# Patient Record
Sex: Male | Born: 1980 | Race: White | Hispanic: No | Marital: Single | Smoking: Never smoker
Health system: Southern US, Community
[De-identification: ages and names within clinical notes are randomized; demographics above are authoritative.]

## PROBLEM LIST (undated history)

## (undated) DIAGNOSIS — R8281 Pyuria: Secondary | ICD-10-CM

## (undated) DIAGNOSIS — I10 Essential (primary) hypertension: Secondary | ICD-10-CM

## (undated) DIAGNOSIS — N189 Chronic kidney disease, unspecified: Secondary | ICD-10-CM

## (undated) DIAGNOSIS — C801 Malignant (primary) neoplasm, unspecified: Secondary | ICD-10-CM

## (undated) DIAGNOSIS — F419 Anxiety disorder, unspecified: Secondary | ICD-10-CM

## (undated) HISTORY — DX: Pyuria: R82.81

## (undated) HISTORY — PX: NO PAST SURGERIES: SHX2092

---

## 2016-04-03 LAB — BASIC METABOLIC PANEL
BUN: 9 mg/dL (ref 4–21)
CREATININE: 1 mg/dL (ref 0.6–1.3)
Glucose: 82 mg/dL
Potassium: 4.3 mmol/L (ref 3.4–5.3)
SODIUM: 142 mmol/L (ref 137–147)

## 2017-03-23 ENCOUNTER — Ambulatory Visit (INDEPENDENT_AMBULATORY_CARE_PROVIDER_SITE_OTHER): Payer: Managed Care, Other (non HMO) | Admitting: Osteopathic Medicine

## 2017-03-23 ENCOUNTER — Encounter: Payer: Self-pay | Admitting: Osteopathic Medicine

## 2017-03-23 VITALS — BP 162/92 | HR 65 | Ht 73.0 in | Wt 268.0 lb

## 2017-03-23 DIAGNOSIS — I10 Essential (primary) hypertension: Secondary | ICD-10-CM | POA: Diagnosis not present

## 2017-03-23 DIAGNOSIS — F419 Anxiety disorder, unspecified: Secondary | ICD-10-CM

## 2017-03-23 DIAGNOSIS — G8929 Other chronic pain: Secondary | ICD-10-CM

## 2017-03-23 DIAGNOSIS — N390 Urinary tract infection, site not specified: Secondary | ICD-10-CM | POA: Diagnosis not present

## 2017-03-23 DIAGNOSIS — M545 Low back pain: Secondary | ICD-10-CM

## 2017-03-23 DIAGNOSIS — R8281 Pyuria: Secondary | ICD-10-CM

## 2017-03-23 LAB — LIPID PANEL
Cholesterol: 177 mg/dL
HDL: 52 mg/dL
LDL Cholesterol: 110 mg/dL — ABNORMAL HIGH
Total CHOL/HDL Ratio: 3.4 ratio
Triglycerides: 75 mg/dL
VLDL: 15 mg/dL

## 2017-03-23 LAB — COMPLETE METABOLIC PANEL WITHOUT GFR
ALT: 18 U/L (ref 9–46)
AST: 19 U/L (ref 10–40)
Albumin: 4.4 g/dL (ref 3.6–5.1)
Alkaline Phosphatase: 63 U/L (ref 40–115)
BUN: 12 mg/dL (ref 7–25)
CO2: 22 mmol/L (ref 20–31)
Calcium: 9.6 mg/dL (ref 8.6–10.3)
Chloride: 105 mmol/L (ref 98–110)
Creat: 0.87 mg/dL (ref 0.60–1.35)
GFR, Est African American: >89 mL/min
GFR, Est Non African American: >89 mL/min
Glucose, Bld: 98 mg/dL (ref 65–99)
Potassium: 4.2 mmol/L (ref 3.5–5.3)
Sodium: 142 mmol/L (ref 135–146)
Total Bilirubin: 0.5 mg/dL (ref 0.2–1.2)
Total Protein: 7.2 g/dL (ref 6.1–8.1)

## 2017-03-23 LAB — CBC WITH DIFFERENTIAL/PLATELET
Basophils Absolute: 77 {cells}/uL (ref 0–200)
Basophils Relative: 1 %
Eosinophils Absolute: 77 {cells}/uL (ref 15–500)
Eosinophils Relative: 1 %
HCT: 46.5 % (ref 38.5–50.0)
Hemoglobin: 15.8 g/dL (ref 13.2–17.1)
Lymphocytes Relative: 21 %
Lymphs Abs: 1617 {cells}/uL (ref 850–3900)
MCH: 29.4 pg (ref 27.0–33.0)
MCHC: 34 g/dL (ref 32.0–36.0)
MCV: 86.4 fL (ref 80.0–100.0)
MPV: 9.1 fL (ref 7.5–12.5)
Monocytes Absolute: 770 {cells}/uL (ref 200–950)
Monocytes Relative: 10 %
Neutro Abs: 5159 {cells}/uL (ref 1500–7800)
Neutrophils Relative %: 67 %
Platelets: 288 10*3/uL (ref 140–400)
RBC: 5.38 MIL/uL (ref 4.20–5.80)
RDW: 13.9 % (ref 11.0–15.0)
WBC: 7.7 10*3/uL (ref 3.8–10.8)

## 2017-03-23 LAB — TSH: TSH: 1.94 m[IU]/L (ref 0.40–4.50)

## 2017-03-23 NOTE — Patient Instructions (Signed)
Check BP outside the office: goal is 130/80 or less.  Labs today See me in 1-2 weeks for BP follow-up and lab review, plus annual checkup

## 2017-03-23 NOTE — Progress Notes (Signed)
HPI: Darryl Roach is a 36 y.o. male  who presents to [place] today, 03/23/17,  for chief complaint of:  Chief Complaint  Patient presents with  . Establish Care    BANK PAIN AND ANXIETY    Anxiety/depression:  . Quality: mostly anxiety . Severity: mild to moderate . Duration: years . Modifying factors: Previously on Paxil, Lexapro, Xanax but all these caused unwanted side effects . Assoc signs/symptoms: no SI  Back Pain . Location: lower mostly on L and wraps around to L abdomen on occasion . Quality: soreness . Duration & Timing: on and off several years . Context: no injury, "history kyphosis"  . Assoc signs/symptoms: no weakness/numbness, no shooting leg pain   Elevated BP: Records reviewed - was 161/110 09/2016 at other visit for back pain at [facility]. No CP/SOB, Been told it was high in the past because of anxiety.   History of abnormal urine: In the past, cloudy urine without dysuria/frequency. Was treated for prostatitis, never had any pain. No STD exposure.   Past medical, surgical, social and family history reviewed: There are no active problems to display for this patient.  No past surgical history on file. Social History  Substance Use Topics  . Smoking status: Not on file  . Smokeless tobacco: Not on file  . Alcohol use Not on file   No family history on file.   Current medication list and allergy/intolerance information reviewed:   No current outpatient prescriptions on file.   No current facility-administered medications for this visit.    Allergies not on file    Review of Systems:  Constitutional:  No  fever, no chills, No recent illness, No unintentional weight changes. +significant fatigue.   HEENT: No  headache, no vision change, no hearing change, No sore throat, No  sinus pressure  Cardiac: No  chest pain, No  pressure, No palpitations, No  Orthopnea  Respiratory:  No  shortness of breath. No   Cough  Gastrointestinal: No  abdominal pain, No  nausea, No  vomiting,  No  blood in stool, No  diarrhea, No  constipation   Musculoskeletal: No new myalgia/arthralgia  Genitourinary: No  incontinence, No  abnormal genital bleeding, No abnormal genital discharge  Skin: No  Rash, No other wounds/concerning lesions  Hem/Onc: No  easy bruising/bleeding, No  abnormal lymph node  Endocrine: No cold intolerance,  No heat intolerance. No polyuria/polydipsia/polyphagia   Neurologic: No  weakness, No  dizziness, No  slurred speech/focal weakness/facial droop  Psychiatric: +concerns with depression, +concerns with anxiety, No sleep problems, No mood problems, +ED  Exam:  BP (!) 162/92   Pulse 65   Ht 6\' 1"  (1.854 m)   Wt 268 lb (121.6 kg)   BMI 35.36 kg/m   Constitutional: VS see above. General Appearance: alert, well-developed, well-nourished, NAD  Eyes: Normal lids and conjunctive, non-icteric sclera  Ears, Nose, Mouth, Throat: MMM, Normal external inspection ears/nares/mouth/lips/gums.  Neck: No masses, trachea midline. No thyroid enlargement. No tenderness/mass appreciated. No lymphadenopathy  Respiratory: Normal respiratory effort. no wheeze, no rhonchi, no rales  Cardiovascular: S1/S2 normal, no murmur, no rub/gallop auscultated. RRR. No lower extremity edema.   Musculoskeletal: Gait normal. No clubbing/cyanosis of digits. (+)thoracic kyphosis pronounced, (+)tender quadratus lumborum area on L, no midline tenderness   Neurological: Normal balance/coordination. No tremor.   Skin: warm, dry, intact. No rash/ulcer.  Psychiatric: Normal judgment/insight. Normal mood and affect. Oriented x3.   Depression screen [facility] 2/9 03/23/2017  Decreased  Interest 3  Down, Depressed, Hopeless 1  PHQ - 2 Score 4  Altered sleeping 2  Tired, decreased energy 1  Change in appetite 3  Feeling bad or failure about yourself  1  Trouble concentrating 1  Moving slowly or fidgety/restless 2   Suicidal thoughts 1  PHQ-9 Score 15   GAD 7 : Generalized Anxiety Score 03/23/2017  Nervous, Anxious, on Edge 3  Control/stop worrying 3  Worry too much - different things 3  Trouble relaxing 2  Restless 2  Easily annoyed or irritable 2  Afraid - awful might happen 2  Total GAD 7 Score 17     ASSESSMENT/PLAN:   Hypertension, unspecified type - Check blood pressure outside the office, goal is 130/80 or less, need to consider medications but would like labs first - Plan: CBC with Differential/Platelet, COMPLETE METABOLIC PANEL WITH GFR, Lipid panel, TSH  Sterile pyuria - History of, urine has been normal lately, will recheck today - Plan: Urinalysis, Routine w reflex microscopic  Anxiety - Consider SSRI/other versus beta blocker for pressure control plus anxiety  Chronic bilateral low back pain without sciatica - Strengthening exercises advised, consider physical therapy. Discussed weight loss, core strengthening - Plan: CBC with Differential/Platelet, COMPLETE METABOLIC PANEL WITH GFR, Urinalysis, Routine w reflex microscopic    Patient Instructions  Check BP outside the office: goal is 130/80 or less.  Labs today See me in 1-2 weeks for BP follow-up and lab review, plus annual checkup     Visit summary with medication list and pertinent instructions was printed for patient to review. All questions at time of visit were answered - patient instructed to contact office with any additional concerns. ER/RTC precautions were reviewed with the patient. Follow-up plan: Return for ANNUAL AND BP RECHECK 1-2 weeks, sooner if needed.

## 2017-03-24 LAB — URINALYSIS, ROUTINE W REFLEX MICROSCOPIC
BILIRUBIN URINE: NEGATIVE
GLUCOSE, UA: NEGATIVE
Hgb urine dipstick: NEGATIVE
Ketones, ur: NEGATIVE
Leukocytes, UA: NEGATIVE
Nitrite: NEGATIVE
PH: 7.5 (ref 5.0–8.0)
PROTEIN: NEGATIVE
Specific Gravity, Urine: 1.019 (ref 1.001–1.035)

## 2017-04-14 ENCOUNTER — Encounter: Payer: Self-pay | Admitting: Osteopathic Medicine

## 2017-04-14 DIAGNOSIS — R8281 Pyuria: Secondary | ICD-10-CM

## 2017-04-14 HISTORY — DX: Pyuria: R82.81

## 2017-04-14 LAB — URINALYSIS
BILIRUBIN: NEGATIVE
Bilirubin: 1
GLUCOSE: NEGATIVE
Glucose: NEGATIVE
KETONE: 15
LEUKOCYTE ESTERASE: NEGATIVE
Leukocyte Esterase: 15
NITRITE: NEGATIVE
Nitrite: NEGATIVE
Protein: 30
Specific Gravity: 1.02
Specific Gravity: 1.02
UROBILINOGEN UA: NORMAL
UROBILINOGEN UA: NORMAL
pH: 6
pH: 6.5

## 2017-07-27 ENCOUNTER — Ambulatory Visit (INDEPENDENT_AMBULATORY_CARE_PROVIDER_SITE_OTHER): Payer: Managed Care, Other (non HMO) | Admitting: Osteopathic Medicine

## 2017-07-27 ENCOUNTER — Encounter: Payer: Self-pay | Admitting: Osteopathic Medicine

## 2017-07-27 VITALS — BP 152/94 | HR 60 | Ht 73.0 in | Wt 277.0 lb

## 2017-07-27 DIAGNOSIS — I10 Essential (primary) hypertension: Secondary | ICD-10-CM | POA: Insufficient documentation

## 2017-07-27 DIAGNOSIS — L299 Pruritus, unspecified: Secondary | ICD-10-CM

## 2017-07-27 MED ORDER — FLUOCINOLONE ACETONIDE 0.01 % EX CREA: TOPICAL_CREAM | Freq: Three times a day (TID) | CUTANEOUS | 1 refills | Status: DC

## 2017-07-27 MED ORDER — HYDROCHLOROTHIAZIDE 12.5 MG PO TABS: 12.5000 mg | ORAL_TABLET | Freq: Every day | ORAL | 1 refills | Status: DC

## 2017-07-27 NOTE — Patient Instructions (Addendum)
Itching: Can try Claritin/Zyrtec/Allegra antihistamines daily  Unscented detergents, soaps, etc!  Lotions: anything for [employer] is pretty good)   Hypertension:  Starting blood pressure medicine  Recheck BP here with nurse in 2 weeks - BP goal 130/80 or under  Labs when we recheck BP

## 2017-07-27 NOTE — Progress Notes (Signed)
HPI: Darryl Roach is a 36 y.o. male  who presents to [place] today, 07/27/17,  for chief complaint of:  Chief Complaint  Patient presents with  . Rash    ARMS AND LEGS IT COMES AND GOES    Rash . Context: History of sensitive skin and occasional rashes. . Location/Quality: Itching and occasional bumps on upper arms, back of legs, back. It is not there presently . Duration/Timing: seems to happen anytime he moves or travels, no association with new soaps/detergents, lotions, other known irritants . Modifying factors: Has tried topical Benadryl and topical over-the-counter steroids  HTN: last seen 03/23/2017 for this - he was instructed to check BP outside the office, reluctant to start meds. Did not keep follow-up. At this point, he has been a bit more diligent about diet/exercise that he is willing to start medications if needed    Past medical history, surgical history, social history and family history reviewed.  Patient Active Problem List   Diagnosis Date Noted  . Sterile pyuria 04/14/2017    Current medication list and allergy/intolerance information reviewed.   Current Outpatient Prescriptions on File Prior to Visit  Medication Sig Dispense Refill  . Omeprazole 20 MG TBEC Take 20 mg by mouth daily.    Marland Kitchen loratadine (CLARITIN) 10 MG tablet Take 10 mg by mouth daily as needed.      No current facility-administered medications on file prior to visit.    No Known Allergies    Review of Systems:  Constitutional: No recent illness  HEENT: No  headache, no vision change  Cardiac: No  chest pain, No  pressure, No palpitations  Respiratory:  No  shortness of breath. No  Cough  Gastrointestinal: No  abdominal pain, no change on bowel habits  Musculoskeletal: No new myalgia/arthralgia  Skin: No  Rash  Hem/Onc: No  easy bruising/bleeding, No  abnormal lumps/bumps  Neurologic: No  weakness, No  Dizziness  Psychiatric: No   concerns with depression, No  concerns with anxiety  Exam:  BP (!) 152/94   Pulse 60   Ht 6\' 1"  (1.854 m)   Wt 277 lb (125.6 kg)   BMI 36.55 kg/m   Constitutional: VS see above. General Appearance: alert, well-developed, well-nourished, NAD  Eyes: Normal lids and conjunctive, non-icteric sclera  Ears, Nose, Mouth, Throat: MMM, Normal external inspection ears/nares/mouth/lips/gums.  Neck: No masses, trachea midline.   Respiratory: Normal respiratory effort. no wheeze, no rhonchi, no rales  Cardiovascular: S1/S2 normal, no murmur, no rub/gallop auscultated. RRR.   Musculoskeletal: Gait normal. Symmetric and independent movement of all extremities  Neurological: Normal balance/coordination. No tremor.  Skin: warm, dry, intact.   Psychiatric: Normal judgment/insight. Normal mood and affect. Oriented x3.      ASSESSMENT/PLAN:   Itching - More likely eczema variant or contact dermatitis of some kind. No rash presently. Trial treatment with oral antihistamine/mild topical steroid - Plan: fluocinolone (VANOS) 0.01 % cream  Essential hypertension - Plan: hydrochlorothiazide (HYDRODIURIL) 12.5 MG tablet, BASIC METABOLIC PANEL WITH GFR    Patient Instructions  Itching: Can try Claritin/Zyrtec/Allegra antihistamines daily  Unscented detergents, soaps, etc!  Lotions: anything for [employer] is pretty good)   Hypertension:  Starting blood pressure medicine  Recheck BP here with nurse in 2 weeks - BP goal 130/80 or under  Labs when we recheck BP     Follow-up plan: Return in about 2 weeks (around 08/10/2017) for BP check w/ nurse.  Visit summary with medication list  and pertinent instructions was printed for patient to review, alert [place] if any changes needed. All questions at time of visit were answered - patient instructed to contact office with any additional concerns. ER/RTC precautions were reviewed with the patient and understanding verbalized.   Note: Total time  spent 25 minutes, greater than 50% of the visit was spent face-to-face counseling and coordinating care for the following: The primary encounter diagnosis was Itching. A diagnosis of Essential hypertension was also pertinent to this visit.Marland Kitchen

## 2017-08-13 ENCOUNTER — Ambulatory Visit (INDEPENDENT_AMBULATORY_CARE_PROVIDER_SITE_OTHER): Payer: Managed Care, Other (non HMO) | Admitting: Osteopathic Medicine

## 2017-08-13 VITALS — BP 139/70 | HR 49 | Ht 73.0 in | Wt 268.9 lb

## 2017-08-13 DIAGNOSIS — I10 Essential (primary) hypertension: Secondary | ICD-10-CM | POA: Diagnosis not present

## 2017-08-13 LAB — BASIC METABOLIC PANEL WITH GFR
BUN: 12 mg/dL (ref 7–25)
CO2: 25 mmol/L (ref 20–32)
CREATININE: 0.84 mg/dL (ref 0.60–1.35)
Calcium: 9.4 mg/dL (ref 8.6–10.3)
Chloride: 102 mmol/L (ref 98–110)
GFR, Est African American: 129 mL/min/{1.73_m2} (ref >=60)
GFR, Est Non African American: 111 mL/min/{1.73_m2} (ref >=60)
Glucose, Bld: 105 mg/dL — ABNORMAL HIGH (ref 65–99)
Potassium: 3.8 mmol/L (ref 3.5–5.3)
SODIUM: 137 mmol/L (ref 135–146)

## 2017-08-13 NOTE — Progress Notes (Signed)
   Subjective:    Patient ID: Darryl Roach, male    DOB: [birth year], 36 y.o.   MRN: [id number]  HPI Pt is here for a BP check. Pt denies chest pain, shortness of breath, palpitations, headaches, or any problems with medication.     Review of Systems     Objective:   Physical Exam        Assessment & Plan:  Pt advised by Dr. Sheppard Coil to return to the office if he has any chest pain, SOB, or any other problems.

## 2017-08-13 NOTE — Progress Notes (Signed)
BP 139/70   Pulse (!) 49   Ht 6\' 1"  (1.854 m)   Wt 268 lb 14.4 oz (122 kg)   SpO2 100%   BMI 35.48 kg/m   HR on the low side as measured but better on auscultation, RRR approx 50s w/ normal S1S2 and no murmur.  Pt has no hx dizziness/lightheadedness or palpitations.   BP systolic still a bit above goal but systolic much better, will see how he does over the next few months with diet/lifestyle modifications.   Plan to follow up in 3-4 months for blood pressure recheck.

## 2017-08-13 NOTE — Progress Notes (Signed)
Pt informed. Pt expressed understanding and is agreeable. Iola Turri CMA, RT 

## 2017-09-24 ENCOUNTER — Other Ambulatory Visit: Payer: Self-pay | Admitting: Osteopathic Medicine

## 2017-09-24 ENCOUNTER — Other Ambulatory Visit: Payer: Self-pay

## 2017-09-24 DIAGNOSIS — I10 Essential (primary) hypertension: Secondary | ICD-10-CM

## 2017-09-24 MED ORDER — HYDROCHLOROTHIAZIDE 12.5 MG PO TABS: 12.5000 mg | ORAL_TABLET | Freq: Every day | ORAL | 0 refills | Status: DC

## 2017-12-30 ENCOUNTER — Other Ambulatory Visit: Payer: Self-pay | Admitting: Osteopathic Medicine

## 2017-12-30 DIAGNOSIS — I10 Essential (primary) hypertension: Secondary | ICD-10-CM

## 2018-02-03 ENCOUNTER — Ambulatory Visit (INDEPENDENT_AMBULATORY_CARE_PROVIDER_SITE_OTHER): Payer: Managed Care, Other (non HMO)

## 2018-02-03 ENCOUNTER — Ambulatory Visit (INDEPENDENT_AMBULATORY_CARE_PROVIDER_SITE_OTHER): Payer: Managed Care, Other (non HMO) | Admitting: Osteopathic Medicine

## 2018-02-03 ENCOUNTER — Encounter: Payer: Self-pay | Admitting: Osteopathic Medicine

## 2018-02-03 VITALS — BP 157/104 | HR 55 | Temp 97.7°F | Wt 268.0 lb

## 2018-02-03 DIAGNOSIS — M4186 Other forms of scoliosis, lumbar region: Secondary | ICD-10-CM

## 2018-02-03 DIAGNOSIS — M545 Low back pain: Secondary | ICD-10-CM | POA: Diagnosis not present

## 2018-02-03 DIAGNOSIS — M5136 Other intervertebral disc degeneration, lumbar region: Secondary | ICD-10-CM | POA: Diagnosis not present

## 2018-02-03 DIAGNOSIS — G8929 Other chronic pain: Secondary | ICD-10-CM

## 2018-02-03 DIAGNOSIS — R635 Abnormal weight gain: Secondary | ICD-10-CM

## 2018-02-03 DIAGNOSIS — I10 Essential (primary) hypertension: Secondary | ICD-10-CM | POA: Diagnosis not present

## 2018-02-03 DIAGNOSIS — R319 Hematuria, unspecified: Secondary | ICD-10-CM

## 2018-02-03 DIAGNOSIS — M438X4 Other specified deforming dorsopathies, thoracic region: Secondary | ICD-10-CM | POA: Diagnosis not present

## 2018-02-03 DIAGNOSIS — Z8781 Personal history of (healed) traumatic fracture: Secondary | ICD-10-CM

## 2018-02-03 MED ORDER — HYDROCHLOROTHIAZIDE 12.5 MG PO TABS: 12.5000 mg | ORAL_TABLET | Freq: Every day | ORAL | 1 refills | Status: DC

## 2018-02-03 NOTE — Progress Notes (Signed)
HPI: Darryl Roach is a 37 y.o. male who  has a past medical history of Sterile pyuria (04/14/2017).  he presents to [facility] today, 02/03/18,  for chief complaint of:  Follow-up blood pressure   Has been out of HCTZ for a few days. BP above goal on first check. No CPSOB, no HA/VC.   Low back pain: years, ?previous injury. No shooting pain or weakness down leg. A bit worse on L.   Occasoinal cloudy urine, hematuria. Worked up in the past and pt was told benign. Never seen urology about this. No dysuria/frequency.     Past medical history, surgical history, social history and family history reviewed. No updates needed.   Current medication list and allergy/intolerance information reviewed.    Current Outpatient Medications on File Prior to Visit  Medication Sig Dispense Refill  . hydrochlorothiazide (HYDRODIURIL) 12.5 MG tablet Take 1 tablet (12.5 mg total) by mouth daily. 30 tablet 0  . loratadine (CLARITIN) 10 MG tablet Take 10 mg by mouth daily as needed.     . Omeprazole 20 MG TBEC Take 20 mg by mouth daily.    . fluocinolone (VANOS) 0.01 % cream Apply topically 3 (three) times daily. To affected area(s) as needed (Patient not taking: Reported on 02/03/2018) 60 g 1   No current facility-administered medications on file prior to visit.    No Known Allergies    Review of Systems:  Constitutional: No recent illness  HEENT: No  headache, no vision change  Cardiac: No  chest pain, No  pressure, No palpitations  Respiratory:  No  shortness of breath. No  Cough  Gastrointestinal: No  abdominal pain, no change on bowel habits  Musculoskeletal: +myalgia/arthralgia  Skin: No  Rash  Neurologic: No  weakness, No  Dizziness  Psychiatric: No  concerns with depression, No  concerns with anxiety  Exam:  BP (!) 157/104 (BP Location: Right Arm)   Pulse (!) 55   Temp 97.7 F (36.5 C) (Oral)   Wt 268 lb (121.6 kg)   BMI 35.36 kg/m    Constitutional: VS see above. General Appearance: alert, well-developed, well-nourished, NAD  Eyes: Normal lids and conjunctive, non-icteric sclera  Ears, Nose, Mouth, Throat: MMM, Normal external inspection ears/nares/mouth/lips/gums.  Neck: No masses, trachea midline.   Respiratory: Normal respiratory effort. no wheeze, no rhonchi, no rales  Cardiovascular: S1/S2 normal, no murmur, no rub/gallop auscultated. RRR.   Musculoskeletal: Gait normal. Symmetric and independent movement of all extremities. Pronounced thoracic kyphosis and reduced lumbar lordosis.   Neurological: Normal balance/coordination. No tremor.  Skin: warm, dry, intact.   Psychiatric: Normal judgment/insight. Normal mood and affect. Oriented x3.     ASSESSMENT/PLAN:   Essential hypertension - Plan: CBC, COMPLETE METABOLIC PANEL WITH GFR, Lipid panel, TSH, hydrochlorothiazide (HYDRODIURIL) 12.5 MG tablet  Chronic bilateral low back pain without sciatica - Plan: Ambulatory referral to Physical Therapy, DG Lumbar Spine 2-3 Views  Weight gain - Plan: TSH  Hematuria, unspecified type - Plan: Urinalysis, Routine w reflex microscopic, Urine Culture  History of compression fracture of vertebral column - XR 01/2018: 10% T12 and 40% T11 old compression deformities, moderate lumbar degenerative changes and mild scoliosis   Meds ordered this encounter  Medications  . hydrochlorothiazide (HYDRODIURIL) 12.5 MG tablet    Sig: Take 1 tablet (12.5 mg total) by mouth daily.    Dispense:  90 tablet    Refill:  1   Dg Lumbar Spine 2-3 Views  Result Date: 02/03/2018  CLINICAL DATA:  Left back pain after falling off a roller skates 2 years ago. The pain is getting worse. EXAM: LUMBAR SPINE - 2-3 VIEW COMPARISON:  None. FINDINGS: Five non-rib-bearing lumbar vertebrae. Mild levoconvex lumbar scoliosis. Mild anterior spur formation at the L3-4 level. Mild to moderate anterior spur formation at the T12-L1 level. Moderate spur  formation at the T10-T11 and T11-T12 levels. There is an approximately 15% compression deformity of the T12 vertebral body and an approximately 40% compression deformity of the T11 vertebral body with no acute fracture lines or bony retropulsion seen. IMPRESSION: 1. Approximately 10% T12 and 40% T11 old vertebral compression deformities. 2. Moderate lower lumbar spine degenerative changes and mild lumbar spine degenerative changes with mild scoliosis. Electronically Signed   By: Claudie Revering M.D.   On: 02/03/2018 10:19     Follow-up plan: Return in about 1 month (around 03/03/2018) for recheck blood pressure and back pain .  Visit summary with medication list and pertinent instructions was printed for patient to review, alert [place] if any changes needed. All questions at time of visit were answered - patient instructed to contact office with any additional concerns. ER/RTC precautions were reviewed with the patient and understanding verbalized.     Please note: voice recognition software was used to produce this document, and typos may escape review. Please contact Dr. Sheppard Coil for any needed clarifications.

## 2018-02-04 LAB — URINALYSIS, ROUTINE W REFLEX MICROSCOPIC
BILIRUBIN URINE: NEGATIVE
GLUCOSE, UA: NEGATIVE
HGB URINE DIPSTICK: NEGATIVE
KETONES UR: NEGATIVE
Leukocytes, UA: NEGATIVE
Nitrite: NEGATIVE
PH: 7.5 (ref 5.0–8.0)
Protein, ur: NEGATIVE
Specific Gravity, Urine: 1.015 (ref 1.001–1.03)

## 2018-02-04 LAB — URINE CULTURE
MICRO NUMBER:: 90358309
RESULT: NO GROWTH
SPECIMEN QUALITY: ADEQUATE

## 2018-02-05 LAB — COMPLETE METABOLIC PANEL WITH GFR
AG RATIO: 1.6 (calc) (ref 1.0–2.5)
ALKALINE PHOSPHATASE (APISO): 63 U/L (ref 40–115)
ALT: 31 U/L (ref 9–46)
AST: 28 U/L (ref 10–40)
Albumin: 4.5 g/dL (ref 3.6–5.1)
BILIRUBIN TOTAL: 0.6 mg/dL (ref 0.2–1.2)
BUN: 12 mg/dL (ref 7–25)
CHLORIDE: 104 mmol/L (ref 98–110)
CO2: 26 mmol/L (ref 20–32)
CREATININE: 0.63 mg/dL (ref 0.60–1.35)
Calcium: 9.6 mg/dL (ref 8.6–10.3)
GFR, Est African American: 144 mL/min/{1.73_m2} (ref >=60)
GFR, Est Non African American: 124 mL/min/{1.73_m2} (ref >=60)
GLOBULIN: 2.9 g/dL (ref 1.9–3.7)
Glucose, Bld: 106 mg/dL — ABNORMAL HIGH (ref 65–99)
POTASSIUM: 4 mmol/L (ref 3.5–5.3)
SODIUM: 138 mmol/L (ref 135–146)
Total Protein: 7.4 g/dL (ref 6.1–8.1)

## 2018-02-05 LAB — CBC
HEMATOCRIT: 46 % (ref 38.5–50.0)
Hemoglobin: 16.3 g/dL (ref 13.2–17.1)
MCH: 30.5 pg (ref 27.0–33.0)
MCHC: 35.4 g/dL (ref 32.0–36.0)
MCV: 86 fL (ref 80.0–100.0)
MPV: 9.2 fL (ref 7.5–12.5)
Platelets: 315 10*3/uL (ref 140–400)
RBC: 5.35 10*6/uL (ref 4.20–5.80)
RDW: 12.9 % (ref 11.0–15.0)
WBC: 9 10*3/uL (ref 3.8–10.8)

## 2018-02-05 LAB — HEMOGLOBIN A1C W/OUT EAG: HEMOGLOBIN A1C: 5.3 %{Hb} (ref <5.7)

## 2018-02-05 LAB — LIPID PANEL
CHOLESTEROL: 202 mg/dL — AB (ref <200)
HDL: 49 mg/dL (ref >40)
LDL Cholesterol (Calc): 134 mg/dL (calc) — ABNORMAL HIGH
Non-HDL Cholesterol (Calc): 153 mg/dL (calc) — ABNORMAL HIGH (ref <130)
Total CHOL/HDL Ratio: 4.1 (calc) (ref <5.0)
Triglycerides: 88 mg/dL (ref <150)

## 2018-02-05 LAB — TSH: TSH: 2.01 m[IU]/L (ref 0.40–4.50)

## 2018-02-17 ENCOUNTER — Ambulatory Visit: Payer: Managed Care, Other (non HMO)

## 2018-02-21 ENCOUNTER — Ambulatory Visit (INDEPENDENT_AMBULATORY_CARE_PROVIDER_SITE_OTHER): Payer: Managed Care, Other (non HMO) | Admitting: Osteopathic Medicine

## 2018-02-21 VITALS — BP 139/95 | HR 49 | Temp 98.5°F

## 2018-02-21 DIAGNOSIS — I10 Essential (primary) hypertension: Secondary | ICD-10-CM

## 2018-02-21 MED ORDER — HYDROCHLOROTHIAZIDE 25 MG PO TABS: 25.0000 mg | ORAL_TABLET | Freq: Every day | ORAL | 1 refills | Status: DC

## 2018-02-21 NOTE — Progress Notes (Signed)
   Subjective:    Patient ID: Darryl Roach, male    DOB: [birth year], 37 y.o.   MRN: [id number]  HPI  Pt presents to office today for BP check. Pt reports taking all of his medications without issue. Pt does not check BPs at home and states he is "high strung" and usually has high readings. Today's initial reading was high at 153/98.   Re-took BP after pt rested for 10 minutes and it was 139/95.   Pt denied any HAs or dizziness.   Review of Systems     Objective:   Physical Exam        Assessment & Plan:  Discussed reading with PCP. Dr Sheppard Coil advised that pt increase dose of HCTZ to 25mg  QD and she will re-check BP at f/u on 03-07-18.

## 2018-02-21 NOTE — Progress Notes (Signed)
Agree w/ nurse note

## 2018-03-07 ENCOUNTER — Ambulatory Visit (INDEPENDENT_AMBULATORY_CARE_PROVIDER_SITE_OTHER): Payer: Managed Care, Other (non HMO) | Admitting: Osteopathic Medicine

## 2018-03-07 ENCOUNTER — Encounter: Payer: Self-pay | Admitting: Osteopathic Medicine

## 2018-03-07 VITALS — BP 146/79 | HR 60 | Temp 98.3°F | Wt 270.9 lb

## 2018-03-07 DIAGNOSIS — I10 Essential (primary) hypertension: Secondary | ICD-10-CM

## 2018-03-07 DIAGNOSIS — Z23 Encounter for immunization: Secondary | ICD-10-CM | POA: Diagnosis not present

## 2018-03-07 NOTE — Progress Notes (Signed)
HPI: Darryl Roach is a 37 y.o. male who  has a past medical history of Sterile pyuria (04/14/2017).  he presents to [facility] today, 03/07/18,  for chief complaint of:  Follow-up blood pressure   Hasn't taken meds this morning, usually takes them in the AM arounf 8 or 9, now after 9:00. BP above goal on first check but improved on recheck though not quite to systolic goal. No CPSOB, no HA/VC. We restarted HCTZ at 12.5 mg on 01/2018, on follow-up was still not at goal so we increased HCTZ to 25 mg daily at nurse visit few weeks ago and he is tolerating the medicine. Weight is about the same. 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   Low back pain: years, ?previous injury. No shooting pain or weakness down leg. A bit worse on L. Scheduling with PT.   Occasoinal cloudy urine, hematuria. Worked up in the past and pt was told benign. Never seen urology about this. No dysuria/frequency. UA normal, UCx negative.     Past medical history, surgical history, social history and family history reviewed. No updates needed.   Current medication list and allergy/intolerance information reviewed.    Current Outpatient Medications on File Prior to Visit  Medication Sig Dispense Refill  . hydrochlorothiazide (HYDRODIURIL) 25 MG tablet Take 1 tablet (25 mg total) by mouth daily. 30 tablet 1  . loratadine (CLARITIN) 10 MG tablet Take 10 mg by mouth daily as needed.     . Omeprazole 20 MG TBEC Take 20 mg by mouth daily.    . fluocinolone (VANOS) 0.01 % cream Apply topically 3 (three) times daily. To affected area(s) as needed (Patient not taking: Reported on 03/07/2018) 60 g 1   No current facility-administered medications on file prior to visit.    No Known Allergies    Review of Systems:  Constitutional: No recent illness  HEENT: No  headache, no vision change  Cardiac: No  chest pain, No  pressure, No palpitations  Respiratory:  No  shortness of breath. No   Cough  Gastrointestinal: No  abdominal pain, no change on bowel habits  Musculoskeletal: +myalgia/arthralgia low back pain and shoulder pain about the same   Skin: No  Rash  Neurologic: No  weakness, No  Dizziness  Psychiatric: No  concerns with depression, No  concerns with anxiety  Exam:  BP (!) 146/79 (BP Location: Right Arm, Patient Position: Sitting, Cuff Size: Normal)   Pulse 60   Temp 98.3 F (36.8 C) (Oral)   Wt 270 lb 14.4 oz (122.9 kg)   BMI 35.74 kg/m   Constitutional: VS see above. General Appearance: alert, well-developed, well-nourished, NAD  Eyes: Normal lids and conjunctive, non-icteric sclera  Ears, Nose, Mouth, Throat: MMM, Normal external inspection ears/nares/mouth/lips/gums.  Neck: No masses, trachea midline.   Respiratory: Normal respiratory effort. no wheeze, no rhonchi, no rales  Cardiovascular: S1/S2 normal, no murmur, no rub/gallop auscultated. RRR.   Musculoskeletal: Gait normal. Symmetric and independent movement of all extremities. Pronounced thoracic kyphosis and reduced lumbar lordosis.   Neurological: Normal balance/coordination. No tremor.  Skin: warm, dry, intact.   Psychiatric: Normal judgment/insight. Normal mood and affect. Oriented x3.     ASSESSMENT/PLAN:   Essential hypertension - Plan: BASIC METABOLIC PANEL WITH GFR, CANCELED: Lipid panel  Need for Tdap vaccination - Plan: Tdap vaccine greater than or equal to 7yo IM   Patient Instructions  Plan: Leave medications as-is for now Lower salt, increase good healthy  fats and protein/fiber Increased exercise/movement  Recheck labs in 1 month for medication safety    Will recheck lipids in 3 mos Diet/exercise discussed   Follow-up plan: Return in about 3 months (around 06/06/2018) for follow-up blood pressure - change meds if not at goal .Lab visit in one month, NO appt needed w/ Dr A for this unless results are abnormal   Visit summary with medication list and pertinent  instructions was printed for patient to review, alert [place] if any changes needed. All questions at time of visit were answered - patient instructed to contact office with any additional concerns. ER/RTC precautions were reviewed with the patient and understanding verbalized.     Please note: voice recognition software was used to produce this document, and typos may escape review. Please contact Dr. Sheppard Coil for any needed clarifications.

## 2018-03-07 NOTE — Patient Instructions (Addendum)
Plan: Leave medications as-is for now Lower salt, increase good healthy fats and protein/fiber Increased exercise/movement  Recheck labs in 1 month for medication safety

## 2018-04-25 ENCOUNTER — Other Ambulatory Visit: Payer: Self-pay | Admitting: Osteopathic Medicine

## 2018-04-25 DIAGNOSIS — I10 Essential (primary) hypertension: Secondary | ICD-10-CM

## 2018-05-30 ENCOUNTER — Other Ambulatory Visit: Payer: Self-pay

## 2018-05-30 DIAGNOSIS — I10 Essential (primary) hypertension: Secondary | ICD-10-CM

## 2018-05-30 MED ORDER — HYDROCHLOROTHIAZIDE 25 MG PO TABS: 25.0000 mg | ORAL_TABLET | Freq: Every day | ORAL | 0 refills | Status: DC

## 2018-06-01 LAB — BASIC METABOLIC PANEL WITH GFR
BUN: 15 mg/dL (ref 7–25)
CALCIUM: 9.9 mg/dL (ref 8.6–10.3)
CO2: 28 mmol/L (ref 20–32)
Chloride: 102 mmol/L (ref 98–110)
Creat: 0.86 mg/dL (ref 0.60–1.35)
GFR, EST AFRICAN AMERICAN: 127 mL/min/{1.73_m2} (ref >=60)
GFR, EST NON AFRICAN AMERICAN: 109 mL/min/{1.73_m2} (ref >=60)
Glucose, Bld: 100 mg/dL — ABNORMAL HIGH (ref 65–99)
Potassium: 3.8 mmol/L (ref 3.5–5.3)
SODIUM: 139 mmol/L (ref 135–146)

## 2018-06-05 NOTE — Progress Notes (Signed)
All labs are normal. 

## 2018-06-07 ENCOUNTER — Ambulatory Visit: Payer: Managed Care, Other (non HMO) | Admitting: Osteopathic Medicine

## 2018-06-15 ENCOUNTER — Ambulatory Visit (INDEPENDENT_AMBULATORY_CARE_PROVIDER_SITE_OTHER): Payer: Managed Care, Other (non HMO) | Admitting: Osteopathic Medicine

## 2018-06-15 ENCOUNTER — Encounter: Payer: Self-pay | Admitting: Osteopathic Medicine

## 2018-06-15 ENCOUNTER — Ambulatory Visit (INDEPENDENT_AMBULATORY_CARE_PROVIDER_SITE_OTHER): Payer: Managed Care, Other (non HMO)

## 2018-06-15 VITALS — BP 155/110 | HR 68 | Temp 98.1°F | Wt 275.9 lb

## 2018-06-15 DIAGNOSIS — M542 Cervicalgia: Secondary | ICD-10-CM

## 2018-06-15 DIAGNOSIS — M25512 Pain in left shoulder: Secondary | ICD-10-CM | POA: Diagnosis not present

## 2018-06-15 DIAGNOSIS — M50122 Cervical disc disorder at C5-C6 level with radiculopathy: Secondary | ICD-10-CM | POA: Diagnosis not present

## 2018-06-15 DIAGNOSIS — I10 Essential (primary) hypertension: Secondary | ICD-10-CM

## 2018-06-15 DIAGNOSIS — G8929 Other chronic pain: Secondary | ICD-10-CM

## 2018-06-15 DIAGNOSIS — M79641 Pain in right hand: Secondary | ICD-10-CM

## 2018-06-15 DIAGNOSIS — M4802 Spinal stenosis, cervical region: Secondary | ICD-10-CM

## 2018-06-15 MED ORDER — BENAZEPRIL-HYDROCHLOROTHIAZIDE 20-25 MG PO TABS: 1.0000 | ORAL_TABLET | Freq: Every day | ORAL | 1 refills | Status: DC

## 2018-06-15 NOTE — Progress Notes (Signed)
HPI: Darryl Roach is a 37 y.o. male who  has a past medical history of Sterile pyuria (04/14/2017).  he presents to [facility] today, 06/15/18,  for chief complaint of:  HTN  Blood pressure still a bit above goal today, patient is willing at this point to modify medications a bit.  No chest pain, pressure, shortness of breath.  Musculoskeletal issues: Chronic neck pain, left shoulder and right hand pain from old injuries.  Has been sometime since x-rays of this area.  He noticeably hunches, he states a history of kyphosis runs in his family.   Past medical history, surgical history, and family history reviewed.  Current medication list and allergy/intolerance information reviewed.   (See remainder of HPI, ROS, Phys Exam below)  BP (!) 155/110 (BP Location: Left Arm, Patient Position: Sitting, Cuff Size: Large)   Pulse 68   Temp 98.1 F (36.7 C) (Oral)   Wt 275 lb 14.4 oz (125.1 kg)   BMI 36.40 kg/m   Dg Cervical Spine 2 Or 3 Views  Result Date: 06/16/2018 CLINICAL DATA:  Worsening chronic RIGHT hand pain, cervical spine pain radiating to LEFT shoulder for 6-7 months, no known injury EXAM: CERVICAL SPINE - 2-3 VIEW COMPARISON:  None FINDINGS: Prevertebral soft tissues normal thickness. Disc space narrowing and endplate spur formation at C5-C6. Vertebral body heights maintained. No fracture, subluxation, or bone destruction. IMPRESSION: Degenerative disc disease changes at C5-C6. Electronically Signed   By: Lavonia Dana M.D.   On: 06/16/2018 08:50   Dg Shoulder Left  Result Date: 06/16/2018 CLINICAL DATA:  Chronic neck and left shoulder pain without known injury. EXAM: LEFT SHOULDER - 2+ VIEW COMPARISON:  None. FINDINGS: There is no evidence of fracture or dislocation. There is no evidence of arthropathy or other focal bone abnormality. Soft tissues are unremarkable. IMPRESSION: Normal left shoulder. Electronically Signed   By: Marijo Conception, M.D.   On:  06/16/2018 08:51   Dg Hand Complete Right  Result Date: 06/16/2018 CLINICAL DATA:  Right hand pain common no known injury, initial encounter EXAM: RIGHT HAND - COMPLETE 3+ VIEW COMPARISON:  None. 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 The fracture fragments appear well corticated consistent with prior trauma. Prior ulnar styloid fracture with nonunion is noted as well. No acute abnormality seen. 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 No other focal abnormality is noted. Electronically Signed   By: Inez Catalina M.D.   On: 06/16/2018 08:51      ASSESSMENT/PLAN:   Essential hypertension  Neck pain - Plan: DG Cervical Spine 2 or 3 views  Chronic left shoulder pain - Plan: DG Shoulder Left  Right hand pain - Plan: DG Hand Complete Right   Meds ordered this encounter  Medications  . benazepril-hydrochlorthiazide (LOTENSIN HCT) 20-25 MG tablet    Sig: Take 1 tablet by mouth daily.    Dispense:  30 tablet    Refill:  1    Patient info printed for some home exercises, advised sports med follow-up for any persistent pain, offered physical therapy referral.    Follow-up plan: Return in about 2 weeks (around 06/29/2018) for recheck BP w/ Dr A .     ############################################ ############################################ ############################################ ############################################    Outpatient Encounter Medications as of 06/15/2018  Medication Sig  . hydrochlorothiazide (HYDRODIURIL) 25 MG tablet Take 1 tablet (25 mg total) by mouth daily. Need to reschedule  f/u appt that was cancelled  . loratadine (CLARITIN) 10 MG tablet Take 10 mg by mouth daily as needed.   . Omeprazole 20 MG TBEC Take 20 mg by mouth daily.   No facility-administered encounter medications on file as of 06/15/2018.    No Known Allergies    Review of  Systems:  Constitutional: No recent illness  HEENT: No  headache, no vision change  Cardiac: No  chest pain, No  pressure, No palpitations  Respiratory:  No  shortness of breath. No  Cough  Gastrointestinal: No  abdominal pain, no change on bowel habits  Musculoskeletal: + myalgia/arthralgia as per HPI  Neurologic: No  weakness, No  Dizziness  Psychiatric: No  concerns with depression, No  concerns with anxiety  Exam:  BP (!) 150/90 (BP Location: Left Arm, Patient Position: Sitting, Cuff Size: Large)   Pulse 69   Temp 98.1 F (36.7 C) (Oral)   Wt 275 lb 14.4 oz (125.1 kg)   BMI 36.40 kg/m   Constitutional: VS see above. General Appearance: alert, well-developed, well-nourished, NAD  Eyes: Normal lids and conjunctive, non-icteric sclera  Ears, Nose, Mouth, Throat: MMM, Normal external inspection ears/nares/mouth/lips/gums.  Neck: No masses, trachea midline.   Respiratory: Normal respiratory effort. no wheeze, no rhonchi, no rales  Cardiovascular: S1/S2 normal, no murmur, no rub/gallop auscultated. RRR.   Musculoskeletal: Gait normal. Symmetric and independent movement of all extremities.  Significant thoracic kyphosis and cervical lordosis.  Normal range of motion left shoulder.  Neurological: Normal balance/coordination. No tremor.  Skin: warm, dry, intact.   Psychiatric: Normal judgment/insight. Normal mood and affect. Oriented x3.   Visit summary with medication list and pertinent instructions was printed for patient to review, advised to alert [place] if any changes needed. All questions at time of visit were answered - patient instructed to contact office with any additional concerns. ER/RTC precautions were reviewed with the patient and understanding verbalized.   Follow-up plan: Return in about 2 weeks (around 06/29/2018) for recheck BP w/ Dr A .  Note: Total time spent 25 minutes, greater than 50% of the visit was spent face-to-face counseling and coordinating care  for the following: The primary encounter diagnosis was Essential hypertension. Diagnoses of Neck pain, Chronic left shoulder pain, and Right hand pain were also pertinent to this visit.Marland Kitchen  Please note: voice recognition software was used to produce this document, and typos may escape review. Please contact Dr. Sheppard Coil for any needed clarifications.

## 2018-06-16 ENCOUNTER — Encounter: Payer: Self-pay | Admitting: Osteopathic Medicine

## 2018-06-30 ENCOUNTER — Ambulatory Visit: Payer: Managed Care, Other (non HMO) | Admitting: Osteopathic Medicine

## 2018-07-15 ENCOUNTER — Other Ambulatory Visit: Payer: Self-pay | Admitting: Osteopathic Medicine

## 2018-07-26 ENCOUNTER — Other Ambulatory Visit: Payer: Self-pay

## 2018-07-26 MED ORDER — BENAZEPRIL-HYDROCHLOROTHIAZIDE 20-25 MG PO TABS: 1.0000 | ORAL_TABLET | Freq: Every day | ORAL | 0 refills | Status: DC

## 2018-07-26 NOTE — Telephone Encounter (Signed)
Called in 1 week of med for pt. Advised him that he MUST keep upcoming OV

## 2018-08-02 ENCOUNTER — Encounter: Payer: Self-pay | Admitting: Osteopathic Medicine

## 2018-08-02 ENCOUNTER — Ambulatory Visit (INDEPENDENT_AMBULATORY_CARE_PROVIDER_SITE_OTHER): Payer: Managed Care, Other (non HMO) | Admitting: Osteopathic Medicine

## 2018-08-02 VITALS — BP 129/81 | HR 52 | Temp 97.8°F | Wt 277.4 lb

## 2018-08-02 DIAGNOSIS — S62101S Fracture of unspecified carpal bone, right wrist, sequela: Secondary | ICD-10-CM | POA: Diagnosis not present

## 2018-08-02 DIAGNOSIS — I10 Essential (primary) hypertension: Secondary | ICD-10-CM | POA: Diagnosis not present

## 2018-08-02 DIAGNOSIS — Z23 Encounter for immunization: Secondary | ICD-10-CM | POA: Diagnosis not present

## 2018-08-02 MED ORDER — BENAZEPRIL-HYDROCHLOROTHIAZIDE 20-25 MG PO TABS: 1.0000 | ORAL_TABLET | Freq: Every day | ORAL | 1 refills | Status: DC

## 2018-08-02 NOTE — Patient Instructions (Addendum)
Call me if cough persists or worsens   Will try volar wrist splint but the thumb splint is probably more helpful. I've sent a referral to a hand surgeon to discuss fixing this wrist!

## 2018-08-02 NOTE — Progress Notes (Signed)
HPI: Darryl Roach is a 37 y.o. male who  has a past medical history of Sterile pyuria (04/14/2017).  he presents to [facility] today, 08/02/18,  for chief complaint of:  BP recheck  143/83 on intake today, better on recheck at 129/81, no CP/SOB.  Tolerating new medications well.  Has noticed a bit of a dry cough, not sure if may be due to medications, he also has seasonal allergies.  06/15/18 (!) 155/110 - adjusted to benazepril-HCT 20-25 daily  03/07/18 (!) 146/79 - HCT 25 daily  02/21/18 (!) 139/95    R Wrist continues to bother him, he is only intermittently using his thumb spica splint, x-ray results and images reviewed in detail with the patient.    Past medical history, surgical history, and family history reviewed.  Current medication list and allergy/intolerance information reviewed.   (See remainder of HPI, ROS, Phys Exam below)    ASSESSMENT/PLAN:   Essential hypertension  Need for influenza vaccination - Plan: Flu Vaccine QUAD 6+ mos PF IM (Fluarix Quad PF)  Closed fracture of right wrist, sequela - Plan: Ambulatory referral to Orthopedic Surgery   Meds ordered this encounter  Medications  . benazepril-hydrochlorthiazide (LOTENSIN HCT) 20-25 MG tablet    Sig: Take 1 tablet by mouth daily.    Dispense:  90 tablet    Refill:  1   Offered to switch to ARB, there have been some issues with recall/back order lately, patient would rather wait and see if cough resolves which I think is reasonable, it is not terribly bothersome for him.   Patient Instructions  Call me if cough persists or worsens   Will try volar wrist splint but the thumb splint is probably more helpful. I've sent a referral to a hand surgeon to discuss fixing this wrist!    Follow-up plan: Return in about 6 months (around 01/31/2019) for [place] - sooner if needed  .             ############################################ ############################################ ############################################ ############################################    Outpatient Encounter Medications as of 08/02/2018  Medication Sig  . benazepril-hydrochlorthiazide (LOTENSIN HCT) 20-25 MG tablet Take 1 tablet by mouth daily.  . hydrochlorothiazide (HYDRODIURIL) 25 MG tablet Take 1 tablet (25 mg total) by mouth daily. Need to reschedule f/u appt that was cancelled  . loratadine (CLARITIN) 10 MG tablet Take 10 mg by mouth daily as needed.   . Omeprazole 20 MG TBEC Take 20 mg by mouth daily.   No facility-administered encounter medications on file as of 08/02/2018.    No Known Allergies    Review of Systems:  Constitutional: No recent illness  HEENT: No  headache, no vision change  Cardiac: No  chest pain, No  pressure, No palpitations  Respiratory:  No  shortness of breath. No  Cough  Gastrointestinal: No  abdominal pain, no change on bowel habits  Musculoskeletal: No new myalgia/arthralgia, chronic R wrist pain   Skin: No  Rash  Psychiatric: No  concerns with depression, No  concerns with anxiety  Exam:  BP 129/81 (BP Location: Left Arm, Patient Position: Sitting, Cuff Size: Large)   Pulse (!) 52   Temp 97.8 F (36.6 C) (Oral)   Wt 277 lb 6.4 oz (125.8 kg)   BMI 36.60 kg/m   Constitutional: VS see above. General Appearance: alert, well-developed, well-nourished, NAD  Eyes: Normal lids and conjunctive, non-icteric sclera  Ears, Nose, Mouth, Throat: MMM, Normal external inspection ears/nares/mouth/lips/gums.  Neck:  No masses, trachea midline.   Respiratory: Normal respiratory effort. no wheeze, no rhonchi, no rales  Cardiovascular: S1/S2 normal, no murmur, no rub/gallop auscultated. RRR.   Musculoskeletal: Gait normal. Symmetric and independent movement of all extremities  Neurological: Normal balance/coordination. No  tremor.  Skin: warm, dry, intact.   Psychiatric: Normal judgment/insight. Normal mood and affect. Oriented x3.   Visit summary with medication list and pertinent instructions was printed for patient to review, advised to alert [place] if any changes needed. All questions at time of visit were answered - patient instructed to contact office with any additional concerns. ER/RTC precautions were reviewed with the patient and understanding verbalized.   Follow-up plan: Return in about 6 months (around 01/31/2019) for [place] - sooner if needed .    Please note: voice recognition software was used to produce this document, and typos may escape review. Please contact Dr. Sheppard Coil for any needed clarifications.

## 2019-01-28 ENCOUNTER — Other Ambulatory Visit: Payer: Self-pay | Admitting: Osteopathic Medicine

## 2019-04-19 ENCOUNTER — Encounter: Payer: Self-pay | Admitting: Osteopathic Medicine

## 2019-04-19 ENCOUNTER — Ambulatory Visit: Payer: Managed Care, Other (non HMO) | Admitting: Osteopathic Medicine

## 2019-04-19 VITALS — BP 137/85 | HR 82 | Wt 261.0 lb

## 2019-04-19 DIAGNOSIS — F411 Generalized anxiety disorder: Secondary | ICD-10-CM | POA: Diagnosis not present

## 2019-04-19 DIAGNOSIS — R0789 Other chest pain: Secondary | ICD-10-CM | POA: Diagnosis not present

## 2019-04-19 DIAGNOSIS — F330 Major depressive disorder, recurrent, mild: Secondary | ICD-10-CM

## 2019-04-19 MED ORDER — VORTIOXETINE HBR 10 MG PO TABS: 10.0000 mg | ORAL_TABLET | Freq: Every day | ORAL | 11 refills | Status: DC

## 2019-04-19 NOTE — Patient Instructions (Signed)
We are starting a medication today called Trintellix to help treat your anxiety. This is a daily medication to help control your symptoms.   I also highly encourage my patients who are suffering from anxiety or depression to seek care with a counselor or therapist. A therapist can coach you in techniques to recognize and deal with troubling thought patterns and behaviors. The ability to cope with external stressors is crucial to overall mental health. Let me know if you'd like me to place a referral to behavioral health for counseling/therapy.   Expect a call or message form this office in the next 2 weeks to check in: If you're doing well on the medicine but not feeling any effect, we can increase the dose. If you're starting to feel some effect/improvement, we can hold off on a dose increase and reevaluate at your office visit.   Let's plan to follow up in the office in 4 weeks. At that time, we can talk about how well the medicine is working for you, and we can consider increasing the dose, adding another medicine, etc.   If we are having trouble finding a good medication regimen for you, we can consult with a psychiatrist to assist with medication management.   If you experience problematic side effects, please let me know ASAP - we can switch the medicine any time, and we don't need an appointment for this.

## 2019-04-19 NOTE — Progress Notes (Signed)
HPI: Darryl Roach is a 38 y.o. male who  has a past medical history of Sterile pyuria (04/14/2017).  he presents to [facility] today, 04/21/19,  for chief complaint of:  Anxiety   Anxiety:  Worse since pandemic, noting some episodes of palpitations/R sided chest pain, was writing this off as anxiety, is not happening with exertion.  He does have a history of some shoulder pain as well on the right side.  Previous medications: Paxil - was on long time ago, recalls was not very helpful, recalls issues w/ withdrawal side effects on discontinuation of the medication. Zoloft - sexual side effect Wellbutrin - did not help Xanax - did not tolerate      At today's visit 04/21/19 ... PMH, PSH, FH reviewed and updated as needed.  Current medication list and allergy/intolerance hx reviewed and updated as needed. (See remainder of HPI, ROS, Phys Exam below)   No results found.  Results for orders placed or performed in visit on 04/19/19 (from the past 72 hour(s))  CBC     Status: None   Collection Time: 04/19/19  3:47 PM  Result Value Ref Range   WBC 10.8 3.8 - 10.8 Thousand/uL   RBC 5.24 4.20 - 5.80 Million/uL   Hemoglobin 15.9 13.2 - 17.1 g/dL   HCT 46.3 38.5 - 50.0 %   MCV 88.4 80.0 - 100.0 fL   MCH 30.3 27.0 - 33.0 pg   MCHC 34.3 32.0 - 36.0 g/dL   RDW 13.2 11.0 - 15.0 %   Platelets 353 140 - 400 Thousand/uL   MPV 9.7 7.5 - 12.5 fL  COMPLETE METABOLIC PANEL WITH GFR     Status: None   Collection Time: 04/19/19  3:47 PM  Result Value Ref Range   Glucose, Bld 95 65 - 99 mg/dL    Comment: .            Fasting reference interval .    BUN 18 7 - 25 mg/dL   Creat 0.85 0.60 - 1.35 mg/dL   GFR, Est Non African American 109 > OR = 60 mL/min/1.61m2   GFR, Est African American 126 > OR = 60 mL/min/1.57m2   BUN/Creatinine Ratio NOT APPLICABLE 6 - 22 (calc)   Sodium 139 135 - 146 mmol/L   Potassium 4.0 3.5 - 5.3 mmol/L   Chloride 103 98 - 110  mmol/L   CO2 26 20 - 32 mmol/L   Calcium 10.0 8.6 - 10.3 mg/dL   Total Protein 7.4 6.1 - 8.1 g/dL   Albumin 4.7 3.6 - 5.1 g/dL   Globulin 2.7 1.9 - 3.7 g/dL (calc)   AG Ratio 1.7 1.0 - 2.5 (calc)   Total Bilirubin 0.3 0.2 - 1.2 mg/dL   Alkaline phosphatase (APISO) 56 36 - 130 U/L   AST 19 10 - 40 U/L   ALT 24 9 - 46 U/L  Lipid panel     Status: Abnormal   Collection Time: 04/19/19  3:47 PM  Result Value Ref Range   Cholesterol 178 <200 mg/dL   HDL 46 > OR = 40 mg/dL   Triglycerides 155 (H) <150 mg/dL   LDL Cholesterol (Calc) 105 (H) mg/dL (calc)    Comment: Reference range: <100 . Desirable range <100 mg/dL for primary prevention;   <70 mg/dL for patients with CHD or diabetic patients  with > or = 2 CHD risk factors. Marland Kitchen LDL-C is now calculated using the Martin-Hopkins  calculation, which is a validated  novel method providing  better accuracy than the Friedewald equation in the  estimation of LDL-C.  Cresenciano Genre et al. Annamaria Helling. 1517;616(07): 2061-2068  (http://education.[employer].com/faq/FAQ164)    Total CHOL/HDL Ratio 3.9 <5.0 (calc)   Non-HDL Cholesterol (Calc) 132 (H) <130 mg/dL (calc)    Comment: For patients with diabetes plus 1 major ASCVD risk  factor, treating to a non-HDL-C goal of <100 mg/dL  (LDL-C of <70 mg/dL) is considered a therapeutic  option.   TSH     Status: None   Collection Time: 04/19/19  3:47 PM  Result Value Ref Range   TSH 0.97 0.40 - 4.50 mIU/L  Magnesium     Status: None   Collection Time: 04/19/19  3:47 PM  Result Value Ref Range   Magnesium 2.0 1.5 - 2.5 mg/dL      EKG interpretation: Rate: 72 Rhythm: sinus No ST/T changes concerning for acute ischemia/infarct  Small Q waves in inferior leads Questionable RBBB incomplete Previous EKG no tracings available for review      ASSESSMENT/PLAN: The primary encounter diagnosis was Generalized anxiety disorder. Diagnoses of Mild episode of recurrent major depressive disorder ([place]) and  Other chest pain were also pertinent to this visit.  Chest pain does not seem cardiac in nature, EKG certainly borderline, would have a low threshold for sending to cardiology to consider stress test but patient would like to try treating anxiety first and see if this resolves his other symptoms.   Orders Placed This Encounter  Procedures  . CBC  . COMPLETE METABOLIC PANEL WITH GFR  . Lipid panel  . TSH  . Magnesium  . EKG 12-Lead     Meds ordered this encounter  Medications  . vortioxetine HBr (TRINTELLIX) 10 MG TABS tablet    Sig: Take 1 tablet (10 mg total) by mouth daily.    Dispense:  30 tablet    Refill:  11    Patient Instructions  We are starting a medication today called Trintellix to help treat your anxiety. This is a daily medication to help control your symptoms.   I also highly encourage my patients who are suffering from anxiety or depression to seek care with a counselor or therapist. A therapist can coach you in techniques to recognize and deal with troubling thought patterns and behaviors. The ability to cope with external stressors is crucial to overall mental health. Let me know if you'd like me to place a referral to behavioral health for counseling/therapy.   Expect a call or message form this office in the next 2 weeks to check in: If you're doing well on the medicine but not feeling any effect, we can increase the dose. If you're starting to feel some effect/improvement, we can hold off on a dose increase and reevaluate at your office visit.   Let's plan to follow up in the office in 4 weeks. At that time, we can talk about how well the medicine is working for you, and we can consider increasing the dose, adding another medicine, etc.   If we are having trouble finding a good medication regimen for you, we can consult with a psychiatrist to assist with medication management.   If you experience problematic side effects, please let me know ASAP - we can switch  the medicine any time, and we don't need an appointment for this.        Follow-up plan: Return in about 4 weeks (around 05/17/2019) for recheck anxiety .                                                 ################################################# ################################################# ################################################# #################################################  No outpatient medications have been marked as taking for the 04/19/19 encounter (Office Visit) with Emeterio Reeve, DO.    No Known Allergies     Review of Systems:  Constitutional: No recent illness  HEENT: No  headache, no vision change  Cardiac: No  chest pain, No  pressure, No palpitations  Respiratory:  No  shortness of breath. No  Cough  Gastrointestinal: No  abdominal pain, no change on bowel habits  Musculoskeletal: No new myalgia/arthralgia  Skin: No  Rash  Hem/Onc: No  easy bruising/bleeding, No  abnormal lumps/bumps  Neurologic: No  weakness, No  Dizziness  Psychiatric: No  concerns with depression, +concerns with anxiety  Depression screen [facility] 2/9 04/19/2019 03/07/2018 03/23/2017  Decreased Interest 1 1 3   Down, Depressed, Hopeless 1 1 1   PHQ - 2 Score 2 2 4   Altered sleeping 2 3 2   Tired, decreased energy 1 1 1   Change in appetite 2 2 3   Feeling bad or failure about yourself  0 0 1  Trouble concentrating 1 1 1   Moving slowly or fidgety/restless 2 0 2  Suicidal thoughts 0 0 1  PHQ-9 Score 10 9 15   Difficult doing work/chores Somewhat difficult Somewhat difficult -   GAD 7 : Generalized Anxiety Score 04/19/2019 03/07/2018 03/23/2017  Nervous, Anxious, on Edge 3 3 3   Control/stop worrying 3 2 3   Worry too much - different things 3 2 3   Trouble relaxing 3 2 2   Restless 3 0 2  Easily annoyed or irritable 3 1 2   Afraid - awful might happen 3 2 2   Total GAD 7 Score 21 12 17   Anxiety Difficulty Somewhat  difficult Somewhat difficult -      Exam:  BP 137/85   Pulse 82   Wt 261 lb (118.4 kg)   SpO2 99%   BMI 34.43 kg/m   BP Readings from Last 3 Encounters:  04/19/19 137/85  08/02/18 129/81  06/15/18 (!) 155/110    Constitutional: VS see above. General Appearance: alert, well-developed, well-nourished, NAD  Eyes: Normal lids and conjunctive, non-icteric sclera  Ears, Nose, Mouth, Throat: MMM, Normal external inspection ears/nares/mouth/lips/gums.  Neck: No masses, trachea midline.   Respiratory: Normal respiratory effort. no wheeze, no rhonchi, no rales  Cardiovascular: S1/S2 normal, no murmur, no rub/gallop auscultated. RRR.   Musculoskeletal: Gait normal. Symmetric and independent movement of all extremities  Neurological: Normal balance/coordination. No tremor.  Skin: warm, dry, intact.   Psychiatric: Normal judgment/insight. Normal mood and affect. Oriented x3.       Visit summary with medication list and pertinent instructions was printed for patient to review, patient was advised to alert [place] if any updates are needed. All questions at time of visit were answered - patient instructed to contact office with any additional concerns. ER/RTC precautions were reviewed with the patient and understanding verbalized.     Please note: voice recognition software was used to produce this document, and typos may escape review. Please contact Dr. Sheppard Coil for any needed clarifications.    Follow up plan: Return in about 4 weeks (around 05/17/2019) for recheck anxiety .

## 2019-04-20 LAB — COMPLETE METABOLIC PANEL WITH GFR
AG Ratio: 1.7 (calc) (ref 1.0–2.5)
ALT: 24 U/L (ref 9–46)
AST: 19 U/L (ref 10–40)
Albumin: 4.7 g/dL (ref 3.6–5.1)
Alkaline phosphatase (APISO): 56 U/L (ref 36–130)
BUN: 18 mg/dL (ref 7–25)
CO2: 26 mmol/L (ref 20–32)
Calcium: 10 mg/dL (ref 8.6–10.3)
Chloride: 103 mmol/L (ref 98–110)
Creat: 0.85 mg/dL (ref 0.60–1.35)
GFR, Est African American: 126 mL/min/{1.73_m2} (ref >=60)
GFR, Est Non African American: 109 mL/min/{1.73_m2} (ref >=60)
Globulin: 2.7 g/dL (calc) (ref 1.9–3.7)
Glucose, Bld: 95 mg/dL (ref 65–99)
Potassium: 4 mmol/L (ref 3.5–5.3)
Sodium: 139 mmol/L (ref 135–146)
Total Bilirubin: 0.3 mg/dL (ref 0.2–1.2)
Total Protein: 7.4 g/dL (ref 6.1–8.1)

## 2019-04-20 LAB — LIPID PANEL
Cholesterol: 178 mg/dL (ref <200)
HDL: 46 mg/dL (ref >=40)
LDL Cholesterol (Calc): 105 mg/dL (calc) — ABNORMAL HIGH
Non-HDL Cholesterol (Calc): 132 mg/dL (calc) — ABNORMAL HIGH (ref <130)
Total CHOL/HDL Ratio: 3.9 (calc) (ref <5.0)
Triglycerides: 155 mg/dL — ABNORMAL HIGH (ref <150)

## 2019-04-20 LAB — TSH: TSH: 0.97 mIU/L (ref 0.40–4.50)

## 2019-04-20 LAB — CBC
HCT: 46.3 % (ref 38.5–50.0)
Hemoglobin: 15.9 g/dL (ref 13.2–17.1)
MCH: 30.3 pg (ref 27.0–33.0)
MCHC: 34.3 g/dL (ref 32.0–36.0)
MCV: 88.4 fL (ref 80.0–100.0)
MPV: 9.7 fL (ref 7.5–12.5)
Platelets: 353 10*3/uL (ref 140–400)
RBC: 5.24 10*6/uL (ref 4.20–5.80)
RDW: 13.2 % (ref 11.0–15.0)
WBC: 10.8 10*3/uL (ref 3.8–10.8)

## 2019-04-20 LAB — MAGNESIUM: Magnesium: 2 mg/dL (ref 1.5–2.5)

## 2019-04-21 ENCOUNTER — Encounter: Payer: Self-pay | Admitting: Osteopathic Medicine

## 2019-04-26 ENCOUNTER — Other Ambulatory Visit: Payer: Self-pay | Admitting: Osteopathic Medicine

## 2019-05-01 ENCOUNTER — Telehealth: Payer: Self-pay | Admitting: Osteopathic Medicine

## 2019-05-01 NOTE — Telephone Encounter (Signed)
Received fax from [employer] that Trintellix requires a PA. Information has been sent to the insurance company. Awaiting determination.

## 2019-05-03 NOTE — Telephone Encounter (Signed)
Received fax from [employer] that Trintellix was approved from 59/97/[id number] until the policy elapses.  Pharmacy notified and form sent to scan.

## 2019-05-17 ENCOUNTER — Ambulatory Visit (INDEPENDENT_AMBULATORY_CARE_PROVIDER_SITE_OTHER): Payer: Managed Care, Other (non HMO) | Admitting: Osteopathic Medicine

## 2019-05-17 ENCOUNTER — Encounter: Payer: Self-pay | Admitting: Osteopathic Medicine

## 2019-05-17 VITALS — BP 135/86 | HR 51 | Temp 98.2°F | Wt 274.9 lb

## 2019-05-17 DIAGNOSIS — F411 Generalized anxiety disorder: Secondary | ICD-10-CM | POA: Diagnosis not present

## 2019-05-17 DIAGNOSIS — F330 Major depressive disorder, recurrent, mild: Secondary | ICD-10-CM

## 2019-05-17 DIAGNOSIS — R0982 Postnasal drip: Secondary | ICD-10-CM

## 2019-05-17 DIAGNOSIS — M25512 Pain in left shoulder: Secondary | ICD-10-CM | POA: Diagnosis not present

## 2019-05-17 DIAGNOSIS — G8929 Other chronic pain: Secondary | ICD-10-CM

## 2019-05-17 DIAGNOSIS — M75102 Unspecified rotator cuff tear or rupture of left shoulder, not specified as traumatic: Secondary | ICD-10-CM | POA: Insufficient documentation

## 2019-05-17 DIAGNOSIS — M542 Cervicalgia: Secondary | ICD-10-CM

## 2019-05-17 MED ORDER — AMOXICILLIN-POT CLAVULANATE 875-125 MG PO TABS: 1.0000 | ORAL_TABLET | Freq: Two times a day (BID) | ORAL | 0 refills | Status: AC

## 2019-05-17 NOTE — Progress Notes (Signed)
HPI: Darryl Roach is a 38 y.o. male who  has a past medical history of Sterile pyuria (04/14/2017).  he presents to [facility] today, 05/17/19,  for chief complaint of:  Follow-up anxiety  Shoulder pain Allergies    Last visit a month ago 04/19/19, we started on Trintellix 10 mg  "Anxiety:  Worse since pandemic, noting some episodes of palpitations/R sided chest pain, was writing this off as anxiety, is not happening with exertion.  He does have a history of some shoulder pain as well on the right side. Previous medications: Paxil - was on long time ago, recalls was not very helpful, recalls issues w/ withdrawal side effects on discontinuation of the medication. Zoloft - sexual side effect Wellbutrin - did not help Xanax - did not tolerate"  Today 05/17/19:  See below for PHQ/GAD, improvement from previous but not quite where he wants to be. Has been on the meds about 2 weeks at this point.   Depression screen [facility] 2/9 05/17/2019 04/19/2019 03/07/2018  Decreased Interest 1 1 1   Down, Depressed, Hopeless 1 1 1   PHQ - 2 Score 2 2 2   Altered sleeping 1 2 3   Tired, decreased energy 1 1 1   Change in appetite 1 2 2   Feeling bad or failure about yourself  1 0 0  Trouble concentrating 2 1 1   Moving slowly or fidgety/restless 1 2 0  Suicidal thoughts 0 0 0  PHQ-9 Score 9 10 9   Difficult doing work/chores Somewhat difficult Somewhat difficult Somewhat difficult   GAD 7 : Generalized Anxiety Score 05/17/2019 04/19/2019 03/07/2018 03/23/2017  Nervous, Anxious, on Edge 2 3 3 3   Control/stop worrying 2 3 2 3   Worry too much - different things 2 3 2 3   Trouble relaxing 2 3 2 2   Restless 2 3 0 2  Easily annoyed or irritable 2 3 1 2   Afraid - awful might happen 2 3 2 2   Total GAD 7 Score 14 21 12 17   Anxiety Difficulty Somewhat difficult Somewhat difficult Somewhat difficult -         Shoulder and neck pain: Would like to see physical  therapy  Sinuses: Postnasal drip has been bothering him, changes antihistamines, has not taken any nasal steroids, does not like decongestants.   Chest discomfort: Resolved as anxiety has improved. I offered sending to cardiology / stress test for complete evaluation and patient declined.      At today's visit 05/17/19 ... PMH, PSH, FH reviewed and updated as needed.  Current medication list and allergy/intolerance hx reviewed and updated as needed. (See remainder of HPI, ROS, Phys Exam below)   No results found.  No results found for this or any previous visit (from the past 72 hour(s)).        ASSESSMENT/PLAN: The primary encounter diagnosis was Generalized anxiety disorder. Diagnoses of Mild episode of recurrent major depressive disorder ([place]), Neck pain, Chronic left shoulder pain, and Postnasal drip were also pertinent to this visit.   Orders Placed This Encounter  Procedures  . Ambulatory referral to Physical Therapy     Meds ordered this encounter  Medications  . amoxicillin-clavulanate (AUGMENTIN) 875-125 MG tablet    Sig: Take 1 tablet by mouth 2 (two) times daily for 7 days.    Dispense:  14 tablet    Refill:  0    Patient Instructions  Anxiety: We can continue the Trintellix at 10 mg for a few more weeks and see  how you do with this.  Can send me a MyChart message on how you are doing.  If okay at current dose, we can continue this indefinitely.  If you would like to increase the dose from 10 mg to 20 mg, that can also be arranged.   Allergies: Even fairly severe allergies can typically be treated successfully with over-the-counter medications. I usually will recommend a combination of: . steroid nasal spray such as Flonase or Nasonex or one of their generic equivalents, PLUS... . an antihistamine such as Allegra, Zyrtec, or Claritin or one of their generic equivalents.  . Can also add a decongestant, either Sudafed on its own OR any of the  antihistamines with the "-D" in the name that he has to show your ID to the pharmacist to buy.  . I recommend avoiding Afrin nasal spray unless absolutely needed, and then using it only for 2-3 days to prevent rebound congestion when the medicine is stopped.  If the combination isn't helping after about 2 weeks, let me know and I can send a referral to allergist!    If you decide you would like to pursue a cardiac stress test or visit with a cardiologist, please let me know!  It would not be a bad idea to at least do a stress test.   I sent a referral to physical therapy for neck pain and shoulder pain.         Follow-up plan: Return in about 4 weeks (around 06/14/2019) for Virtual visit to recheck anxiety, allergies.                                                 ################################################# ################################################# ################################################# #################################################    Current Meds  Medication Sig  . benazepril-hydrochlorthiazide (LOTENSIN HCT) 20-25 MG tablet TAKE 1 TABLET BY MOUTH EVERY DAY  . Cetirizine HCl (ZYRTEC PO) Take by mouth.  . Omeprazole 20 MG TBEC Take 20 mg by mouth daily.  Marland Kitchen vortioxetine HBr (TRINTELLIX) 10 MG TABS tablet Take 1 tablet (10 mg total) by mouth daily.    No Known Allergies     Review of Systems:  Constitutional: No recent illness  HEENT: No  headache, no vision change  Cardiac: No  chest pain, No  pressure, No palpitations  Respiratory:  No  shortness of breath. No  Cough  Gastrointestinal: No  abdominal pain, no change on bowel habits  Musculoskeletal: +myalgia/arthralgia  Neurologic: No  weakness, No  Dizziness  Psychiatric: +concerns with depression, +concerns with anxiety  Exam:  BP 135/86 (BP Location: Left Arm, Patient Position: Sitting, Cuff Size: Normal)   Pulse (!) 51   Temp 98.2  F (36.8 C) (Oral)   Wt 274 lb 14.4 oz (124.7 kg)   BMI 36.27 kg/m   Constitutional: VS see above. General Appearance: alert, well-developed, well-nourished, NAD  Eyes: Normal lids and conjunctive, non-icteric sclera  Neck: No masses, trachea midline.   Respiratory: Normal respiratory effort.   Musculoskeletal: Gait normal. Symmetric and independent movement of all extremities  Neurological: Normal balance/coordination. No tremor.  Skin: warm, dry, intact.   Psychiatric: Normal judgment/insight. Normal mood and affect. Oriented x3.       Visit summary with medication list and pertinent instructions was printed for patient to review, patient was advised to alert [place] if any updates are needed. All questions at time  of visit were answered - patient instructed to contact office with any additional concerns. ER/RTC precautions were reviewed with the patient and understanding verbalized.   Please note: voice recognition software was used to produce this document, and typos may escape review. Please contact Dr. Sheppard Coil for any needed clarifications.    Follow up plan: Return in about 4 weeks (around 06/14/2019) for Virtual visit to recheck anxiety, allergies.

## 2019-05-17 NOTE — Patient Instructions (Addendum)
Anxiety: We can continue the Trintellix at 10 mg for a few more weeks and see how you do with this.  Can send me a MyChart message on how you are doing.  If okay at current dose, we can continue this indefinitely.  If you would like to increase the dose from 10 mg to 20 mg, that can also be arranged.   Allergies: Even fairly severe allergies can typically be treated successfully with over-the-counter medications. I usually will recommend a combination of: . steroid nasal spray such as Flonase or Nasonex or one of their generic equivalents, PLUS... . an antihistamine such as Allegra, Zyrtec, or Claritin or one of their generic equivalents.  . Can also add a decongestant, either Sudafed on its own OR any of the antihistamines with the "-D" in the name that he has to show your ID to the pharmacist to buy.  . I recommend avoiding Afrin nasal spray unless absolutely needed, and then using it only for 2-3 days to prevent rebound congestion when the medicine is stopped.  If the combination isn't helping after about 2 weeks, let me know and I can send a referral to allergist!    If you decide you would like to pursue a cardiac stress test or visit with a cardiologist, please let me know!  It would not be a bad idea to at least do a stress test.   I sent a referral to physical therapy for neck pain and shoulder pain.

## 2019-06-05 ENCOUNTER — Encounter (INDEPENDENT_AMBULATORY_CARE_PROVIDER_SITE_OTHER): Payer: Self-pay

## 2019-06-05 ENCOUNTER — Telehealth: Payer: Managed Care, Other (non HMO) | Admitting: Family

## 2019-06-05 DIAGNOSIS — Z20822 Contact with and (suspected) exposure to covid-19: Secondary | ICD-10-CM

## 2019-06-05 MED ORDER — BENZONATATE 100 MG PO CAPS: 100.0000 mg | ORAL_CAPSULE | Freq: Three times a day (TID) | ORAL | 0 refills | Status: DC | PRN

## 2019-06-05 NOTE — Progress Notes (Signed)
E-Visit for Corona Virus Screening   Your current symptoms could be consistent with the coronavirus.  Many health care providers can now test patients at their office but not all are.  [facility] has multiple testing sites. For information on our COVID testing locations and hours go to [email or web address]  Please quarantine yourself while awaiting your test results.  We are enrolling you in our [place] for [place] . Daily you will receive a questionnaire within the [facility] website. Our COVID 19 response team willl be monitoriing your responses daily.    COVID-19 is a respiratory illness with symptoms that are similar to the flu. Symptoms are typically mild to moderate, but there have been cases of severe illness and death due to the virus. The following symptoms may appear 2-14 days after exposure: . Fever . Cough . Shortness of breath or difficulty breathing . Chills . Repeated shaking with chills . Muscle pain . Headache . Sore throat . New loss of taste or smell . Fatigue . Congestion or runny nose . Nausea or vomiting . Diarrhea  It is vitally important that if you feel that you have an infection such as this virus or any other virus that you stay home and away from places where you may spread it to others.  You should self-quarantine for 14 days if you have symptoms that could potentially be coronavirus or have been in close contact a with a person diagnosed with COVID-19 within the last 2 weeks. You should avoid contact with people age 56 and older.   You should wear a mask or cloth face covering over your nose and mouth if you must be around other people or animals, including pets (even at home). Try to stay at least 6 feet away from other people. This will protect the people around you.  You can use medication such as A prescription cough medication called Tessalon Perles 100 mg. You may take 1-2 capsules every 8 hours as needed for  cough  You may also take acetaminophen (Tylenol) as needed for fever.  Approximately 5 minutes was spent documenting and reviewing patient's chart.    Reduce your risk of any infection by using the same precautions used for avoiding the common cold or flu:  Marland Kitchen Wash your hands often with soap and warm water for at least 20 seconds.  If soap and water are not readily available, use an alcohol-based hand sanitizer with at least 60% alcohol.  . If coughing or sneezing, cover your mouth and nose by coughing or sneezing into the elbow areas of your shirt or coat, into a tissue or into your sleeve (not your hands). . Avoid shaking hands with others and consider head nods or verbal greetings only. . Avoid touching your eyes, nose, or mouth with unwashed hands.  . Avoid close contact with people who are sick. . Avoid places or events with large numbers of people in one location, like concerts or sporting events. . Carefully consider travel plans you have or are making. . If you are planning any travel outside or inside the [place], visit the CDC's Travelers' Health webpage for the latest health notices. . If you have some symptoms but not all symptoms, continue to monitor at home and seek medical attention if your symptoms worsen. . If you are having a medical emergency, call 911.  HOME CARE . Only take medications as instructed by your medical team. . Drink plenty of fluids and get plenty of rest. . A  steam or ultrasonic humidifier can help if you have congestion.   GET HELP RIGHT AWAY IF YOU HAVE EMERGENCY WARNING SIGNS** FOR COVID-19. If you or someone is showing any of these signs seek emergency medical care immediately. Call 911 or proceed to your closest emergency facility if: . You develop worsening high fever. . Trouble breathing . Bluish lips or face . Persistent pain or pressure in the chest . New confusion . Inability to wake or stay awake . You cough up blood. . Your symptoms become more  severe  **This list is not all possible symptoms. Contact your medical provider for any symptoms that are sever or concerning to you.   MAKE SURE YOU   Understand these instructions.  Will watch your condition.  Will get help right away if you are not doing well or get worse.  Your e-visit answers were reviewed by a board certified advanced clinical practitioner to complete your personal care plan.  Depending on the condition, your plan could have included both over the counter or prescription medications.  If there is a problem please reply once you have received a response from your provider.  Your safety is important to [place].  If you have drug allergies check your prescription carefully.    You can use MyChart to ask questions about today's visit, request a non-urgent call back, or ask for a work or school excuse for 24 hours related to this e-Visit. If it has been greater than 24 hours you will need to follow up with your provider, or enter a new e-Visit to address those concerns. You will get an e-mail in the next two days asking about your experience.  I hope that your e-visit has been valuable and will speed your recovery. Thank you for using e-visits.

## 2019-06-14 ENCOUNTER — Ambulatory Visit (INDEPENDENT_AMBULATORY_CARE_PROVIDER_SITE_OTHER): Payer: Managed Care, Other (non HMO) | Admitting: Osteopathic Medicine

## 2019-06-14 ENCOUNTER — Encounter: Payer: Self-pay | Admitting: Osteopathic Medicine

## 2019-06-14 ENCOUNTER — Other Ambulatory Visit: Payer: Self-pay

## 2019-06-14 DIAGNOSIS — F330 Major depressive disorder, recurrent, mild: Secondary | ICD-10-CM | POA: Diagnosis not present

## 2019-06-14 DIAGNOSIS — F411 Generalized anxiety disorder: Secondary | ICD-10-CM

## 2019-06-14 MED ORDER — VORTIOXETINE HBR 10 MG PO TABS: 10.0000 mg | ORAL_TABLET | Freq: Every day | ORAL | 3 refills | Status: DC

## 2019-06-14 NOTE — Progress Notes (Signed)
Virtual Visit via Video (App used: Dox[employer]) Note  I connected with      Darryl Roach on 06/14/19 at 8:10 AM by a telemedicine application and verified that I am speaking with the correct person using two identifiers.  Patient is at home I am I noffice   I discussed the limitations of evaluation and management by telemedicine and the availability of in person appointments. The patient expressed understanding and agreed to proceed.  History of Present Illness: Darryl Roach is a 38 y.o. male who would like to discuss follow-up anxiety  We had started Trintellix not too long ago, patient reports doing really well on the 10 mg dose.  Still has moments of anxiety but does not have the significant panic symptoms that he used to.  No problematic side effects or other concerns with the medication.  He would like to stay on this dose.        Observations/Objective: There were no vitals taken for this visit. BP Readings from Last 3 Encounters:  05/17/19 135/86  04/19/19 137/85  08/02/18 129/81   Exam: Normal Speech.  NAD  Lab and Radiology Results No results found for this or any previous visit (from the past 72 hour(s)). No results found.     Assessment and Plan: 38 y.o. male with The primary encounter diagnosis was Generalized anxiety disorder. A diagnosis of Mild episode of recurrent major depressive disorder ([place]) was also pertinent to this visit.   PDMP not reviewed this encounter. No orders of the defined types were placed in this encounter.  Meds ordered this encounter  Medications  . vortioxetine HBr (TRINTELLIX) 10 MG TABS tablet    Sig: Take 1 tablet (10 mg total) by mouth daily.    Dispense:  90 tablet    Refill:  3   Patient Instructions  Okay to continue the Trintellix at 10 mg dosage.  If you decide at any point that you want to discuss this further, change doses or change medications, please do not hesitate to reach out!  Otherwise, refills for 90-day  supply for a year have been sent to your pharmacy.     Instructions sent via MyChart. If MyChart not available, pt was given option for info via personal e-mail w/ no guarantee of protected health info over unsecured e-mail communication, and MyChart sign-up instructions were included.   Follow Up Instructions: Return in about 6 months (around 12/15/2019) for Follow-up to monitor blood pressure, see me sooner if needed!.    I discussed the assessment and treatment plan with the patient. The patient was provided an opportunity to ask questions and all were answered. The patient agreed with the plan and demonstrated an understanding of the instructions.   The patient was advised to call back or seek an in-person evaluation if any new concerns, if symptoms worsen or if the condition fails to improve as anticipated.  15 minutes of non-face-to-face time was provided during this encounter.                      Historical information moved to improve visibility of documentation.  Past Medical History:  Diagnosis Date  . Sterile pyuria 04/14/2017   No past surgical history on file. Social History   Tobacco Use  . Smoking status: Never Smoker  . Smokeless tobacco: Never Used  Substance Use Topics  . Alcohol use: Not on file   family history includes Alcohol abuse in his brother, father, and maternal grandfather; Cancer  in his paternal grandfather; Depression in his mother; Diabetes in his mother; Heart attack in his maternal grandmother and paternal grandmother; Stroke in his paternal grandfather.  Medications: Current Outpatient Medications  Medication Sig Dispense Refill  . benazepril-hydrochlorthiazide (LOTENSIN HCT) 20-25 MG tablet TAKE 1 TABLET BY MOUTH EVERY DAY 90 tablet 0  . Cetirizine HCl (ZYRTEC PO) Take by mouth.    . Omeprazole 20 MG TBEC Take 20 mg by mouth daily.    Marland Kitchen vortioxetine HBr (TRINTELLIX) 10 MG TABS tablet Take 1 tablet (10 mg total) by mouth  daily. 90 tablet 3   No current facility-administered medications for this visit.    No Known Allergies  PDMP not reviewed this encounter. No orders of the defined types were placed in this encounter.  Meds ordered this encounter  Medications  . vortioxetine HBr (TRINTELLIX) 10 MG TABS tablet    Sig: Take 1 tablet (10 mg total) by mouth daily.    Dispense:  90 tablet    Refill:  3

## 2019-06-14 NOTE — Patient Instructions (Signed)
Okay to continue the Trintellix at 10 mg dosage.  If you decide at any point that you want to discuss this further, change doses or change medications, please do not hesitate to reach out!  Otherwise, refills for 90-day supply for a year have been sent to your pharmacy.

## 2019-07-15 IMAGING — DX DG LUMBAR SPINE 2-3V
4 series · 4 of 4 positions shown · non-contrast
Comparison: None.

CLINICAL DATA: Left back pain after falling off a roller skates 2
years ago. The pain is getting worse.

EXAM:
LUMBAR SPINE - 2-3 VIEW

[l-spine ap (1 of 2)]
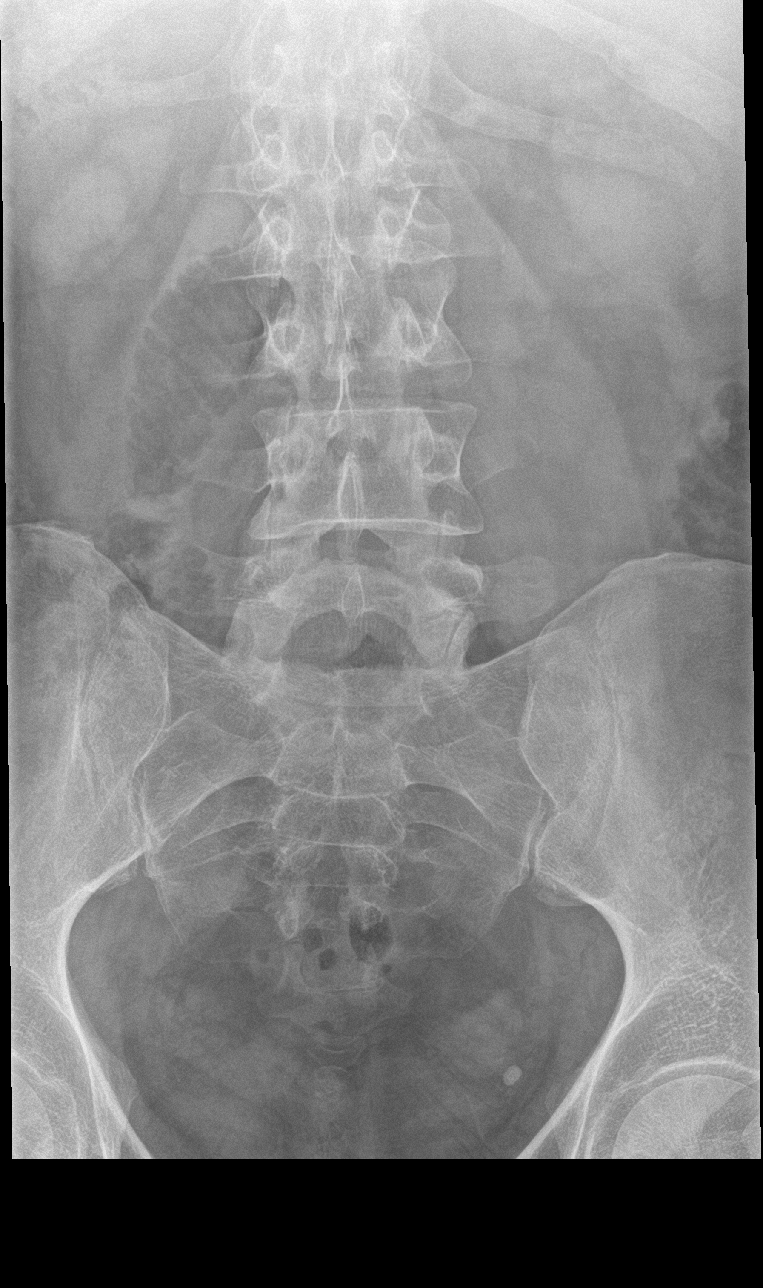

[l-spine lat]
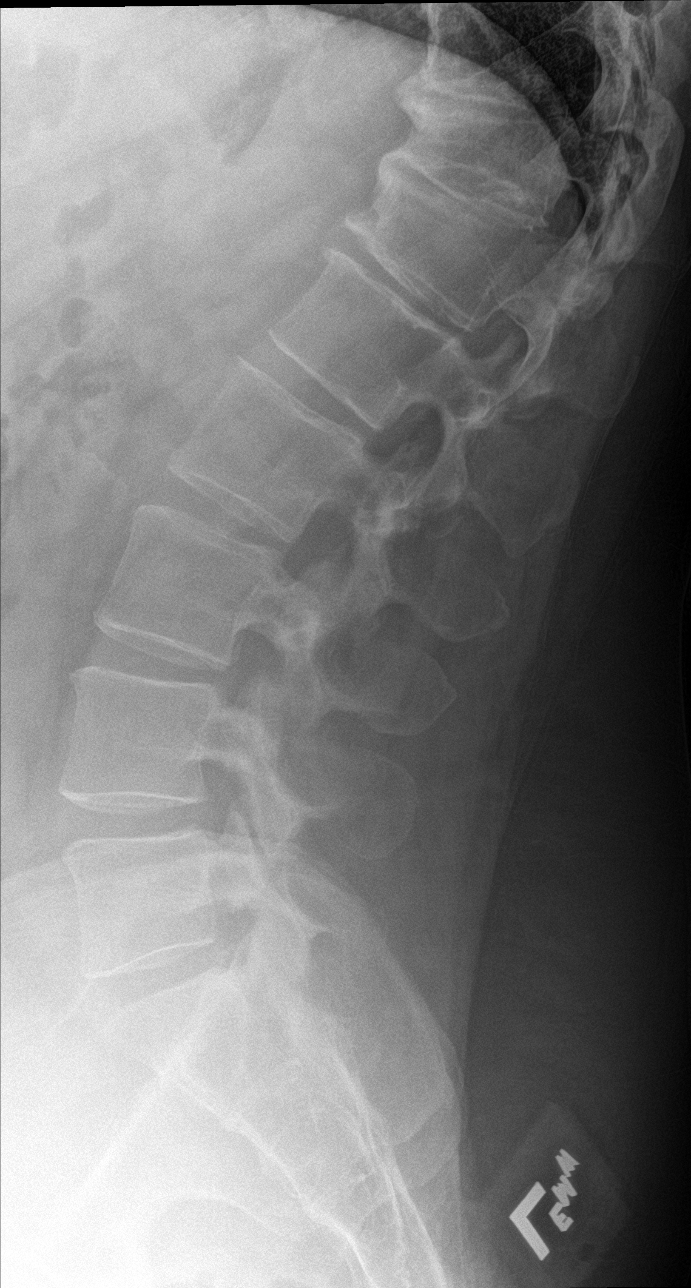

[l-spine spot]
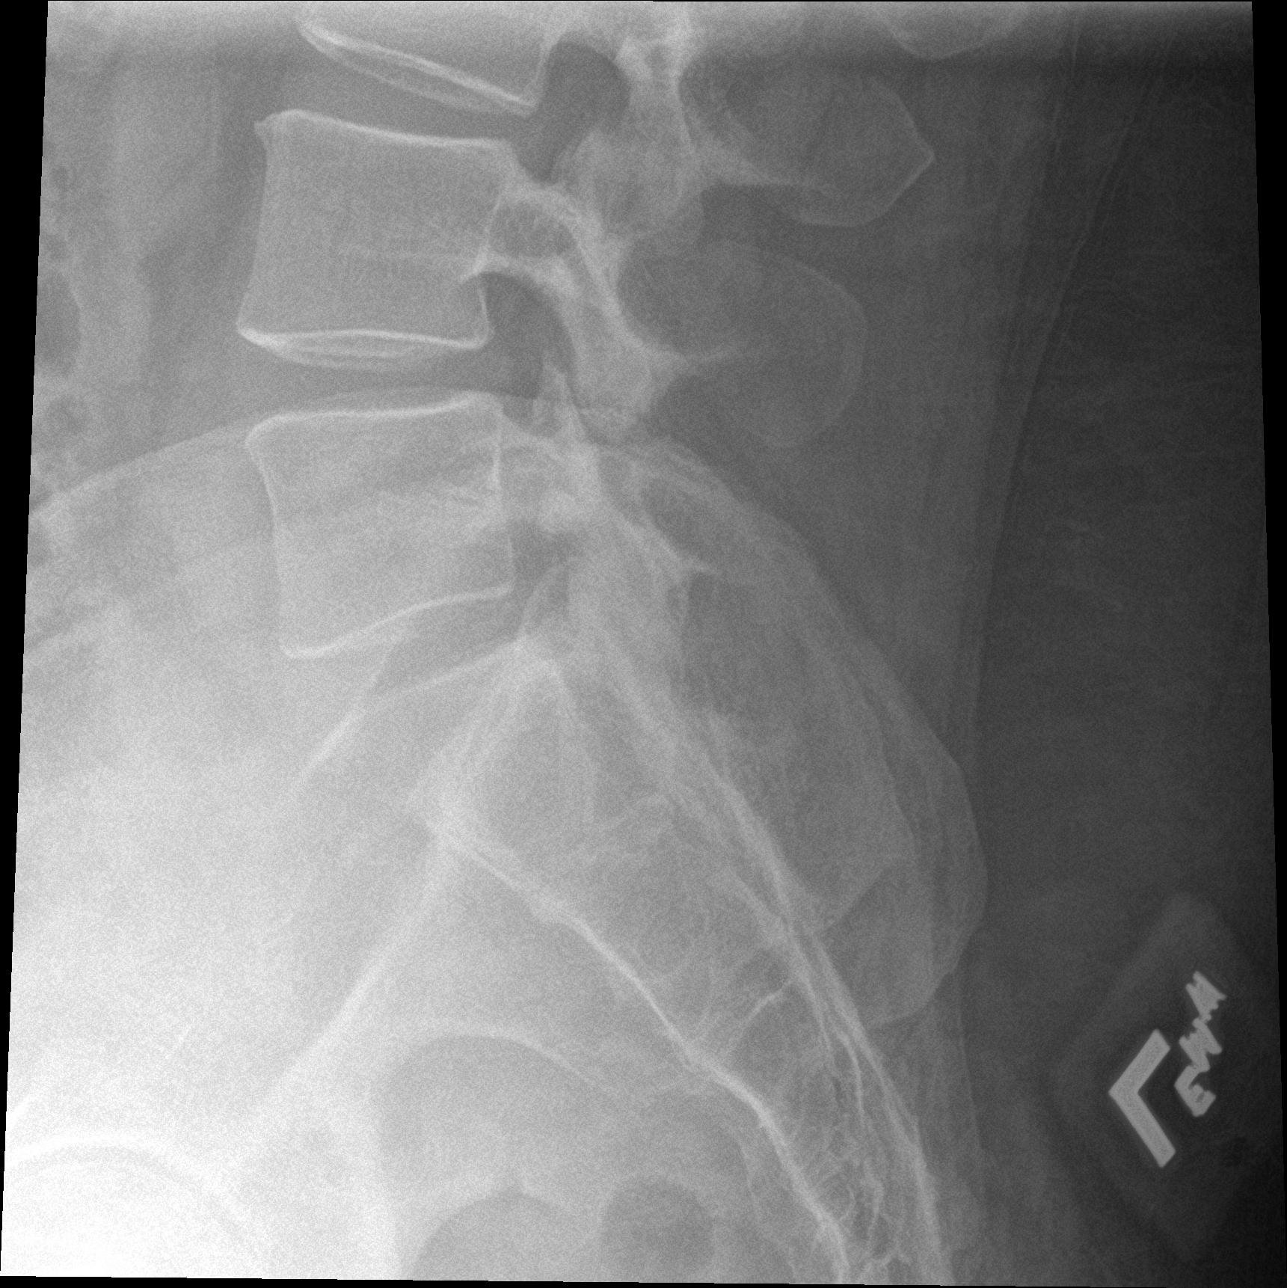

[l-spine ap (2 of 2)]
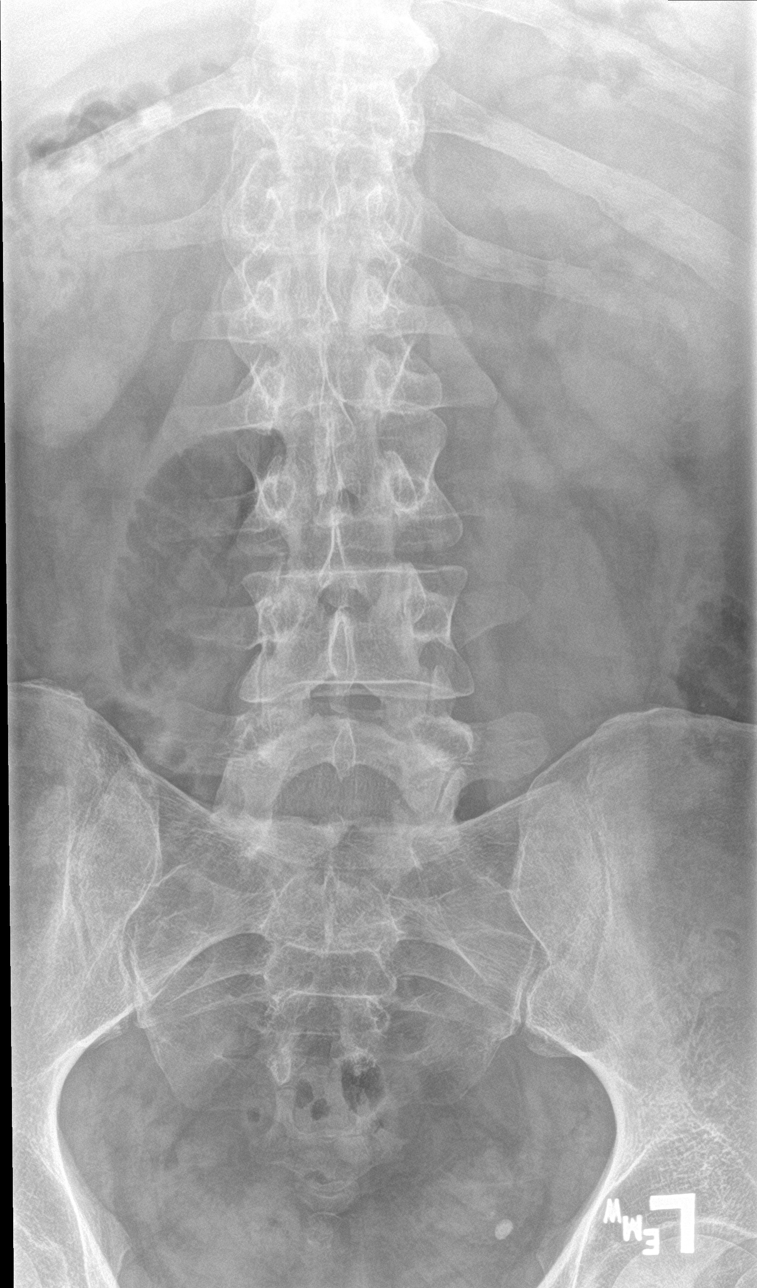

[4 of 4 positions shown; findings below may reference images not displayed]

FINDINGS: Five non-rib-bearing lumbar vertebrae. Mild levoconvex lumbar
scoliosis. Mild anterior spur formation at the L3-4 level. Mild to
moderate anterior spur formation at the T12-L1 level. Moderate spur
formation at the T10-T11 and T11-T12 levels. There is an
approximately 15% compression deformity of the T12 vertebral body
and an approximately 40% compression deformity of the T11 vertebral
body with no acute fracture lines or bony retropulsion seen.
IMPRESSION: 1. Approximately 10% T12 and 40% T11 old vertebral compression
deformities.
2. Moderate lower lumbar spine degenerative changes and mild lumbar
spine degenerative changes with mild scoliosis.

## 2019-07-29 ENCOUNTER — Other Ambulatory Visit: Payer: Self-pay | Admitting: Osteopathic Medicine

## 2019-09-15 ENCOUNTER — Telehealth: Payer: Self-pay | Admitting: Osteopathic Medicine

## 2019-09-15 MED ORDER — BENAZEPRIL-HYDROCHLOROTHIAZIDE 20-25 MG PO TABS: 1.0000 | ORAL_TABLET | Freq: Every day | ORAL | 0 refills | Status: DC

## 2019-09-15 NOTE — Telephone Encounter (Signed)
Sent to [facility]

## 2019-09-15 NOTE — Telephone Encounter (Signed)
Patient has been made aware. No further questions at this time.  

## 2019-09-15 NOTE — Telephone Encounter (Signed)
Patient wanting to know if he can get a refill of his benazepril-hydrochlorthiazide (LOTENSIN HCT) 20-25 MG tablet [id number] until next appointment. Please advise.

## 2019-09-26 ENCOUNTER — Ambulatory Visit: Payer: Managed Care, Other (non HMO) | Admitting: Osteopathic Medicine

## 2019-10-02 ENCOUNTER — Other Ambulatory Visit: Payer: Self-pay

## 2019-10-02 ENCOUNTER — Ambulatory Visit: Payer: Managed Care, Other (non HMO) | Admitting: Osteopathic Medicine

## 2019-10-02 ENCOUNTER — Encounter: Payer: Self-pay | Admitting: Osteopathic Medicine

## 2019-10-02 VITALS — BP 133/87 | HR 69 | Temp 98.2°F | Wt 296.1 lb

## 2019-10-02 DIAGNOSIS — I10 Essential (primary) hypertension: Secondary | ICD-10-CM

## 2019-10-02 DIAGNOSIS — G8929 Other chronic pain: Secondary | ICD-10-CM

## 2019-10-02 DIAGNOSIS — M25512 Pain in left shoulder: Secondary | ICD-10-CM

## 2019-10-02 DIAGNOSIS — F330 Major depressive disorder, recurrent, mild: Secondary | ICD-10-CM | POA: Diagnosis not present

## 2019-10-02 DIAGNOSIS — Z3009 Encounter for other general counseling and advice on contraception: Secondary | ICD-10-CM | POA: Diagnosis not present

## 2019-10-02 DIAGNOSIS — F411 Generalized anxiety disorder: Secondary | ICD-10-CM | POA: Diagnosis not present

## 2019-10-02 MED ORDER — VORTIOXETINE HBR 10 MG PO TABS: 10.0000 mg | ORAL_TABLET | Freq: Every day | ORAL | 3 refills | Status: DC

## 2019-10-02 MED ORDER — BENAZEPRIL-HYDROCHLOROTHIAZIDE 20-25 MG PO TABS: 1.0000 | ORAL_TABLET | Freq: Every day | ORAL | 3 refills | Status: DC

## 2019-10-02 NOTE — Progress Notes (Signed)
HPI: Darryl Roach is a 38 y.o. male who  has a past medical history of Sterile pyuria (04/14/2017).  he presents to [facility] today, 10/02/19,  for chief complaint of:  Anxiety BP Shoulder/arm pain  Anxiety overall feels pretty well controlled on current medications.  Would like to continue the Trintellix 10 mg.  Blood pressure: No chest pain, pressure, shortness of breath, headache, dizziness.  Tolerating medications okay.  Notes some occasional left arm numbness ulnar aspect of forearm, seems to be worse if he has been sleeping on it in a strange position, sitting all day at desk.  No injury, he does have some history of rotator cuff issues on that side, no neck pain.  Doing better when he was frequently engaging in physical therapy exercises for that shoulder.    At today's visit 10/02/19 ... PMH, PSH, FH reviewed and updated as needed.  Current medication list and allergy/intolerance hx reviewed and updated as needed. (See remainder of HPI, ROS, Phys Exam below)   No results found.  No results found for this or any previous visit (from the past 72 hour(s)).        ASSESSMENT/PLAN: The primary encounter diagnosis was Generalized anxiety disorder. Diagnoses of Vasectomy evaluation, Mild episode of recurrent major depressive disorder ([place]), Chronic left shoulder pain, and Essential hypertension were also pertinent to this visit.   Orders Placed This Encounter  Procedures  . Ambulatory referral to Urology     Meds ordered this encounter  Medications  . benazepril-hydrochlorthiazide (LOTENSIN HCT) 20-25 MG tablet    Sig: Take 1 tablet by mouth daily.    Dispense:  90 tablet    Refill:  3  . vortioxetine HBr (TRINTELLIX) 10 MG TABS tablet    Sig: Take 1 tablet (10 mg total) by mouth daily.    Dispense:  90 tablet    Refill:  3    There are no Patient Instructions on file for this visit.    Follow-up plan: Return in about 6  months (around 03/31/2020) for ANNUAL (call week prior to visit for lab orders), VISIT WITH SPORTS MEDICINE FOR SHOULDER/ARM ISSUE.                                                 ################################################# ################################################# ################################################# #################################################    Current Meds  Medication Sig  . benazepril-hydrochlorthiazide (LOTENSIN HCT) 20-25 MG tablet Take 1 tablet by mouth daily.  . Cetirizine HCl (ZYRTEC PO) Take by mouth.  . Omeprazole 20 MG TBEC Take 20 mg by mouth daily.  Marland Kitchen vortioxetine HBr (TRINTELLIX) 10 MG TABS tablet Take 1 tablet (10 mg total) by mouth daily.  . [DISCONTINUED] benazepril-hydrochlorthiazide (LOTENSIN HCT) 20-25 MG tablet Take 1 tablet by mouth daily.  . [DISCONTINUED] vortioxetine HBr (TRINTELLIX) 10 MG TABS tablet Take 1 tablet (10 mg total) by mouth daily.    No Known Allergies     Review of Systems:  Constitutional: No recent illness  HEENT: No  headache, no vision change  Cardiac: No  chest pain, No  pressure, No palpitations  Respiratory:  No  shortness of breath. No  Cough  Gastrointestinal: No  abdominal pain, no change on bowel habits  Musculoskeletal: No new myalgia/arthralgia  Skin: No  Rash  Hem/Onc: No  easy bruising/bleeding, No  abnormal lumps/bumps  Neurologic: No  weakness, No  Dizziness  Psychiatric: No  concerns with depression, No  concerns with anxiety  Exam:  BP 133/87 (BP Location: Right Arm, Patient Position: Sitting, Cuff Size: Large)   Pulse 69   Temp 98.2 F (36.8 C) (Oral)   Wt 296 lb 1.9 oz (134.3 kg)   BMI 39.07 kg/m   Constitutional: VS see above. General Appearance: alert, well-developed, well-nourished, NAD  Eyes: Normal lids and conjunctive, non-icteric sclera  Ears, Nose, Mouth, Throat: MMM, Normal external inspection  ears/nares/mouth/lips/gums.  Neck: No masses, trachea midline.   Respiratory: Normal respiratory effort. no wheeze, no rhonchi, no rales  Cardiovascular: S1/S2 normal, no murmur, no rub/gallop auscultated. RRR.   Musculoskeletal: Gait normal. Symmetric and independent movement of all extremities.  Borderline apprehension test for left shoulder, otherwise normal range of motion.  Negative Spurling's to L   Neurological: Normal balance/coordination. No tremor.  Skin: warm, dry, intact.   Psychiatric: Normal judgment/insight. Normal mood and affect. Oriented x3.       Visit summary with medication list and pertinent instructions was printed for patient to review, patient was advised to alert [place] if any updates are needed. All questions at time of visit were answered - patient instructed to contact office with any additional concerns. ER/RTC precautions were reviewed with the patient and understanding verbalized.   Note: Total time spent 25 minutes, greater than 50% of the visit was spent face-to-face counseling and coordinating care for the following: The primary encounter diagnosis was Generalized anxiety disorder. Diagnoses of Vasectomy evaluation, Mild episode of recurrent major depressive disorder ([place]), Chronic left shoulder pain, and Essential hypertension were also pertinent to this visit.Marland Kitchen  Please note: voice recognition software was used to produce this document, and typos may escape review. Please contact Dr. Sheppard Coil for any needed clarifications.    Follow up plan: Return in about 6 months (around 03/31/2020) for ANNUAL (call week prior to visit for lab orders), VISIT WITH SPORTS MEDICINE FOR SHOULDER/ARM ISSUE.

## 2019-10-09 ENCOUNTER — Ambulatory Visit: Payer: Managed Care, Other (non HMO) | Admitting: Sports Medicine

## 2019-10-16 ENCOUNTER — Other Ambulatory Visit: Payer: Self-pay

## 2019-10-16 ENCOUNTER — Encounter: Payer: Self-pay | Admitting: Sports Medicine

## 2019-10-16 ENCOUNTER — Ambulatory Visit (INDEPENDENT_AMBULATORY_CARE_PROVIDER_SITE_OTHER): Payer: Managed Care, Other (non HMO)

## 2019-10-16 ENCOUNTER — Ambulatory Visit (INDEPENDENT_AMBULATORY_CARE_PROVIDER_SITE_OTHER): Payer: Managed Care, Other (non HMO) | Admitting: Sports Medicine

## 2019-10-16 DIAGNOSIS — M25512 Pain in left shoulder: Secondary | ICD-10-CM | POA: Diagnosis not present

## 2019-10-16 DIAGNOSIS — S46012A Strain of muscle(s) and tendon(s) of the rotator cuff of left shoulder, initial encounter: Secondary | ICD-10-CM

## 2019-10-16 MED ORDER — MELOXICAM 15 MG PO TABS: ORAL_TABLET | ORAL | 3 refills | Status: DC

## 2019-10-16 NOTE — Assessment & Plan Note (Signed)
Suspect acute injuries to the supraspinatus and the infraspinatus, he does have significant weakness, multiple impingement signs, because of this and because we are considering surgical planning we are going to proceed with x-ray and MRI now. Adding meloxicam, formal PT. Further management will depend on MRI results.

## 2019-10-16 NOTE — Progress Notes (Signed)
Subjective:    CC: Left shoulder pain  HPI: This is a pleasant 38 year old male, for some time, greater than a year he has had pain in his left shoulder, he has had conservative treatment, physician directed.  More recently he jerked an object, it jerked back, and he had severe pain in his left shoulder, and weakness.  Localized over the deltoid.  Severe persistent.  I reviewed the past medical history, family history, social history, surgical history, and allergies today and no changes were needed.  Please see the problem list section below in epic for further details.  Past Medical History: Past Medical History:  Diagnosis Date  . Sterile pyuria 04/14/2017   Past Surgical History: No past surgical history on file. Social History: Social History   Socioeconomic History  . Marital status: Single    Spouse name: Not on file  . Number of children: Not on file  . Years of education: Not on file  . Highest education level: Not on file  Occupational History  . Not on file  Social Needs  . Financial resource strain: Not on file  . Food insecurity    Worry: Not on file    Inability: Not on file  . Transportation needs    Medical: Not on file    Non-medical: Not on file  Tobacco Use  . Smoking status: Never Smoker  . Smokeless tobacco: Never Used  Substance and Sexual Activity  . Alcohol use: Not on file  . Drug use: Not on file  . Sexual activity: Not on file  Lifestyle  . Physical activity    Days per week: Not on file    Minutes per session: Not on file  . Stress: Not on file  Relationships  . Social Herbalist on phone: Not on file    Gets together: Not on file    Attends religious service: Not on file    Active member of club or organization: Not on file    Attends meetings of clubs or organizations: Not on file    Relationship status: Not on file  Other Topics Concern  . Not on file  Social History Narrative  . Not on file   Family History: Family  History  Problem Relation Age of Onset  . Depression Mother   . Diabetes Mother   . Alcohol abuse Father   . Alcohol abuse Brother   . Heart attack Maternal Grandmother   . Alcohol abuse Maternal Grandfather   . Heart attack Paternal Grandmother   . Cancer Paternal Grandfather        PROSTATE  . Stroke Paternal Grandfather    Allergies: No Known Allergies Medications: See med rec.  Review of Systems: No fevers, chills, night sweats, weight loss, chest pain, or shortness of breath.   Objective:    General: Well Developed, well nourished, and in no acute distress.  Neuro: Alert and oriented x3, extra-ocular muscles intact, sensation grossly intact.  HEENT: Normocephalic, atraumatic, pupils equal round reactive to light, neck supple, no masses, no lymphadenopathy, thyroid nonpalpable.  Skin: Warm and dry, no rashes. Cardiac: Regular rate and rhythm, no murmurs rubs or gallops, no lower extremity edema.  Respiratory: Clear to auscultation bilaterally. Not using accessory muscles, speaking in full sentences. Left shoulder: Inspection reveals no abnormalities, atrophy or asymmetry. Palpation is normal with no tenderness over AC joint or bicipital groove. ROM is full in all planes. Rotator cuff strength very weak to abduction and external rotation  Positive Neer and Hawkin's tests, empty can. Speeds and Yergason's tests normal. No labral pathology noted with negative Obrien's, negative crank, negative clunk, and good stability. Normal scapular function observed. No painful arc and no drop arm sign. No apprehension sign  Impression and Recommendations:    Rotator cuff tear, left Suspect acute injuries to the supraspinatus and the infraspinatus, he does have significant weakness, multiple impingement signs, because of this and because we are considering surgical planning we are going to proceed with x-ray and MRI now. Adding meloxicam, formal PT. Further management will depend on  MRI results.   ___________________________________________ Gwen Her. Dianah Field, M.D., ABFM., CAQSM. Primary Care and Sports Medicine [facility]  Adjunct Professor of [place] [employer] [facility] [employer] [facility]

## 2019-12-20 ENCOUNTER — Ambulatory Visit (INDEPENDENT_AMBULATORY_CARE_PROVIDER_SITE_OTHER): Payer: Managed Care, Other (non HMO)

## 2019-12-20 ENCOUNTER — Ambulatory Visit: Payer: Managed Care, Other (non HMO) | Admitting: Family Medicine

## 2019-12-20 ENCOUNTER — Other Ambulatory Visit: Payer: Self-pay

## 2019-12-20 ENCOUNTER — Encounter: Payer: Self-pay | Admitting: Family Medicine

## 2019-12-20 VITALS — BP 149/100 | HR 76 | Temp 98.4°F | Ht 73.0 in | Wt 270.0 lb

## 2019-12-20 DIAGNOSIS — W182XXA Fall in (into) shower or empty bathtub, initial encounter: Secondary | ICD-10-CM

## 2019-12-20 DIAGNOSIS — R0781 Pleurodynia: Secondary | ICD-10-CM | POA: Diagnosis not present

## 2019-12-20 MED ORDER — KETOROLAC TROMETHAMINE 60 MG/2ML IM SOLN
60.0000 mg | Freq: Once | INTRAMUSCULAR | Status: AC
  Administered 2019-12-20: 60 mg via INTRAMUSCULAR

## 2019-12-20 MED ORDER — CYCLOBENZAPRINE HCL 10 MG PO TABS: 10.0000 mg | ORAL_TABLET | Freq: Three times a day (TID) | ORAL | 0 refills | Status: DC | PRN

## 2019-12-20 NOTE — Patient Instructions (Addendum)
Try meloxicam daily (ok to start tomorrow) I have also sent in a prescription for flexeril.  This is a muscle relaxer and can make you drowsy. Use caution when driving.  Stop downstairs and have xray completed. We'll be in touch with results.

## 2019-12-20 NOTE — Progress Notes (Signed)
Darryl Roach - 39 y.o. male MRN [id number]  Date of birth: July 06, 1981  Subjective Chief Complaint  Patient presents with  . Back Pain    HPI Darryl Roach is a 39 y.o. male here today for acute visit with complaint of back/flank pain.  He reports falling out of his shower 3 days ago.  He hit his R flank area pretty hard on his toilet when he fell.  Had mild pain initially but worsening over the past few days.  He denies nausea, hematuria, bruising or shortness of breath.  He has not tried anything other than heat/cold due to concerns of interaction between SSRI and NSAIDS.  Of note he does also have history of kyphosis and prior compression fx of lower thoracic spine.   ROS:  A comprehensive ROS was completed and negative except as noted per HPI  No Known Allergies  Past Medical History:  Diagnosis Date  . Sterile pyuria 04/14/2017    History reviewed. No pertinent surgical history.  Social History   Socioeconomic History  . Marital status: Single    Spouse name: Not on file  . Number of children: Not on file  . Years of education: Not on file  . Highest education level: Not on file  Occupational History  . Not on file  Tobacco Use  . Smoking status: Never Smoker  . Smokeless tobacco: Never Used  Substance and Sexual Activity  . Alcohol use: Not on file  . Drug use: Not on file  . Sexual activity: Not on file  Other Topics Concern  . Not on file  Social History Narrative  . Not on file   Social Determinants of Health   Financial Resource Strain:   . Difficulty of Paying Living Expenses: Not on file  Food Insecurity:   . Worried About Charity fundraiser in the Last Year: Not on file  . Ran Out of Food in the Last Year: Not on file  Transportation Needs:   . Lack of Transportation (Medical): Not on file  . Lack of Transportation (Non-Medical): Not on file  Physical Activity:   . Days of Exercise per Week: Not on file  . Minutes of Exercise per Session: Not on file   Stress:   . Feeling of Stress : Not on file  Social Connections:   . Frequency of Communication with Friends and Family: Not on file  . Frequency of Social Gatherings with Friends and Family: Not on file  . Attends Religious Services: Not on file  . Active Member of Clubs or Organizations: Not on file  . Attends Archivist Meetings: Not on file  . Marital Status: Not on file    Family History  Problem Relation Age of Onset  . Depression Mother   . Diabetes Mother   . Alcohol abuse Father   . Alcohol abuse Brother   . Heart attack Maternal Grandmother   . Alcohol abuse Maternal Grandfather   . Heart attack Paternal Grandmother   . Cancer Paternal Grandfather        PROSTATE  . Stroke Paternal Grandfather     Health Maintenance  Topic Date Due  . HIV Screening  05/02/2020 (Originally 12/22/1993)  . TETANUS/TDAP  03/07/2028  . INFLUENZA VACCINE  Completed    ----------------------------------------------------------------------------------------------------------------------------------------------------------------------------------------------------------------- Physical Exam BP (!) 149/100   Pulse 76   Temp 98.4 F (36.9 C) (Oral)   Ht 6\' 1"  (1.854 m)   Wt 270 lb (122.5 kg)   BMI 35.62  kg/m   Physical Exam Constitutional:      Appearance: Normal appearance.  Eyes:     General: No scleral icterus. Cardiovascular:     Rate and Rhythm: Normal rate and regular rhythm.  Musculoskeletal:     Cervical back: Normal range of motion.     Comments: ROM of thoracic and L spine limited in extension.  Improved some with flexion.  TTP over R lower ribs.    Skin:    General: Skin is warm and dry.  Neurological:     Mental Status: He is alert.  Psychiatric:        Mood and Affect: Mood normal.        Behavior: Behavior normal.      ------------------------------------------------------------------------------------------------------------------------------------------------------------------------------------------------------------------- Assessment and Plan  Rib pain on right side Injection of toradol given today.  Has meloxicam at home, will start tomorrow.  Xray with rib detail ordered.  Flexeril added on, discussed that this may cause drowsiness.  Avoid driving.      This visit occurred during the SARS-CoV-2 public health emergency.  Safety protocols were in place, including screening questions prior to the visit, additional usage of staff PPE, and extensive cleaning of exam room while observing appropriate contact time as indicated for disinfecting solutions.

## 2019-12-20 NOTE — Assessment & Plan Note (Signed)
Injection of toradol given today.  Has meloxicam at home, will start tomorrow.  Xray with rib detail ordered.  Flexeril added on, discussed that this may cause drowsiness.  Avoid driving.

## 2020-03-19 ENCOUNTER — Other Ambulatory Visit: Payer: Self-pay

## 2020-03-19 ENCOUNTER — Ambulatory Visit: Payer: Managed Care, Other (non HMO) | Admitting: Family Medicine

## 2020-03-19 ENCOUNTER — Encounter: Payer: Self-pay | Admitting: Family Medicine

## 2020-03-19 VITALS — BP 135/76 | HR 60 | Ht 72.84 in | Wt 264.5 lb

## 2020-03-19 DIAGNOSIS — R1032 Left lower quadrant pain: Secondary | ICD-10-CM | POA: Diagnosis not present

## 2020-03-19 DIAGNOSIS — S46119A Strain of muscle, fascia and tendon of long head of biceps, unspecified arm, initial encounter: Secondary | ICD-10-CM

## 2020-03-19 MED ORDER — METRONIDAZOLE 500 MG PO TABS: 500.0000 mg | ORAL_TABLET | Freq: Three times a day (TID) | ORAL | 0 refills | Status: DC

## 2020-03-19 MED ORDER — CIPROFLOXACIN HCL 500 MG PO TABS: 500.0000 mg | ORAL_TABLET | Freq: Two times a day (BID) | ORAL | 0 refills | Status: AC

## 2020-03-19 NOTE — Progress Notes (Signed)
Darryl Roach - 39 y.o. male MRN [id number]  Date of birth: 03-13-1981  Subjective No chief complaint on file.   HPI Darryl Roach is a 39 y.o. male here today with complaint of bicep pain and abdominal pain.   -Abdominal pain:  Has had intermittent abdominal pain for several years.  Reports soft stools associated with this.  Recently developed increased pain, localized to LLQ.  Has sensation that he needs to have a bowel movement but is unable to.  He denies blood in his stool, fever, chills, nausea or vomiting.  He has not noticed anything that makes this better or worse.  No prior history of diverticulosis/diverticulitis.  No family history of colon cancer that he is aware of.   -Bicep pain:  Reports that he was moving a pot at work and hit his arm on the refrigerator.  Felt a sharp pain that lasted a few seconds and then was able to continue with regular tasks.  He had has some pain in his arm and shoulder prior to this which actually improved after the injury.  Shortly after he noted that the muscle looked like it had a "lump" in it.  He feels like he still has pretty good strength and he denies significant pain.   ROS:  A comprehensive ROS was completed and negative except as noted per HPI  No Known Allergies  Past Medical History:  Diagnosis Date  . Sterile pyuria 04/14/2017    No past surgical history on file.  Social History   Socioeconomic History  . Marital status: Single    Spouse name: Not on file  . Number of children: Not on file  . Years of education: Not on file  . Highest education level: Not on file  Occupational History  . Not on file  Tobacco Use  . Smoking status: Never Smoker  . Smokeless tobacco: Never Used  Substance and Sexual Activity  . Alcohol use: Not on file  . Drug use: Not on file  . Sexual activity: Not on file  Other Topics Concern  . Not on file  Social History Narrative  . Not on file   Social Determinants of Health   Financial Resource  Strain:   . Difficulty of Paying Living Expenses:   Food Insecurity:   . Worried About Charity fundraiser in the Last Year:   . Arboriculturist in the Last Year:   Transportation Needs:   . Film/video editor (Medical):   Marland Kitchen Lack of Transportation (Non-Medical):   Physical Activity:   . Days of Exercise per Week:   . Minutes of Exercise per Session:   Stress:   . Feeling of Stress :   Social Connections:   . Frequency of Communication with Friends and Family:   . Frequency of Social Gatherings with Friends and Family:   . Attends Religious Services:   . Active Member of Clubs or Organizations:   . Attends Archivist Meetings:   Marland Kitchen Marital Status:     Family History  Problem Relation Age of Onset  . Depression Mother   . Diabetes Mother   . Alcohol abuse Father   . Alcohol abuse Brother   . Heart attack Maternal Grandmother   . Alcohol abuse Maternal Grandfather   . Heart attack Paternal Grandmother   . Cancer Paternal Grandfather        PROSTATE  . Stroke Paternal Grandfather     Health Maintenance  Topic Date Due  .  COVID-19 Vaccine (1) Never done  . HIV Screening  05/02/2020 (Originally 12/22/1993)  . INFLUENZA VACCINE  06/16/2020  . TETANUS/TDAP  03/07/2028     ----------------------------------------------------------------------------------------------------------------------------------------------------------------------------------------------------------------- Physical Exam BP 135/76 (BP Location: Left Arm, Patient Position: Sitting, Cuff Size: Large)   Pulse 60   Ht 6' 0.84" (1.85 m)   Wt 264 lb 8 oz (120 kg)   SpO2 100%   BMI 35.05 kg/m   Physical Exam Constitutional:      Appearance: Normal appearance.  HENT:     Head: Normocephalic and atraumatic.  Eyes:     General: No scleral icterus. Cardiovascular:     Rate and Rhythm: Normal rate and regular rhythm.  Pulmonary:     Effort: Pulmonary effort is normal.  Abdominal:      General: Abdomen is flat. Bowel sounds are normal. There is no distension.     Tenderness: There is abdominal tenderness (LLQ). There is no guarding.  Musculoskeletal:     Comments: L bicep with retraction (popeye sign).  Negative hook sign at distal biceps tendon.  Non tender along bicipital groove. Decreased strength with pronation on the L   Skin:    General: Skin is warm and dry.  Neurological:     General: No focal deficit present.     Mental Status: He is alert.  Psychiatric:        Mood and Affect: Mood normal.        Behavior: Behavior normal.     ------------------------------------------------------------------------------------------------------------------------------------------------------------------------------------------------------------------- Assessment and Plan  Rupture of proximal biceps tendon, initial encounter Discussed with Dr. Darene Lamer who also examined patient and agrees that this is consistent with proximal biceps tendon rupture. Doesn't really bother him too much so no intervention needed at this time.   Left lower quadrant abdominal pain Suspect diverticulitis.  Stat CT abdomen/pelvis ordered to be scheduled for tomorrow.   Start cipro/flagyl for empiric coverage.  Discussed red flags including worsening pain, fever, or blood in stool.  He expresses understanding.    Meds ordered this encounter  Medications  . ciprofloxacin (CIPRO) 500 MG tablet    Sig: Take 1 tablet (500 mg total) by mouth 2 (two) times daily for 7 days.    Dispense:  14 tablet    Refill:  0  . metroNIDAZOLE (FLAGYL) 500 MG tablet    Sig: Take 1 tablet (500 mg total) by mouth 3 (three) times daily.    Dispense:  21 tablet    Refill:  0    No follow-ups on file.    This visit occurred during the SARS-CoV-2 public health emergency.  Safety protocols were in place, including screening questions prior to the visit, additional usage of staff PPE, and extensive cleaning of exam room  while observing appropriate contact time as indicated for disinfecting solutions.

## 2020-03-19 NOTE — Patient Instructions (Signed)

## 2020-03-19 NOTE — Assessment & Plan Note (Signed)
Suspect diverticulitis.  Stat CT abdomen/pelvis ordered to be scheduled for tomorrow.   Start cipro/flagyl for empiric coverage.  Discussed red flags including worsening pain, fever, or blood in stool.  He expresses understanding.

## 2020-03-19 NOTE — Assessment & Plan Note (Signed)
Discussed with Dr. Darene Lamer who also examined patient and agrees that this is consistent with proximal biceps tendon rupture. Doesn't really bother him too much so no intervention needed at this time.

## 2020-03-20 ENCOUNTER — Telehealth: Payer: Self-pay

## 2020-03-20 ENCOUNTER — Ambulatory Visit (INDEPENDENT_AMBULATORY_CARE_PROVIDER_SITE_OTHER): Payer: Managed Care, Other (non HMO)

## 2020-03-20 DIAGNOSIS — R1032 Left lower quadrant pain: Secondary | ICD-10-CM | POA: Diagnosis not present

## 2020-03-20 DIAGNOSIS — N2889 Other specified disorders of kidney and ureter: Secondary | ICD-10-CM

## 2020-03-20 LAB — I-STAT CREATININE (MANUAL ENTRY): Creatinine, Ser: 0.7 (ref 0.50–1.10)

## 2020-03-20 MED ORDER — IOHEXOL 300 MG/ML  SOLN
100.0000 mL | Freq: Once | INTRAMUSCULAR | Status: AC | PRN
Start: 2020-03-20 — End: 2020-03-20
  Administered 2020-03-20: 100 mL via INTRAVENOUS

## 2020-03-20 NOTE — Telephone Encounter (Signed)
Call report for solid enhancing mass left kidney concerning for benign or malignancy renal neoplasm recommend MRI with and without to further evaluate also recommend a referral to urology. I did not get the name of the person I spoke with. She states the report will be faxed.

## 2020-03-20 NOTE — Telephone Encounter (Signed)
Spoke with patient regarding results of CT scan. No evidence of diverticulitis.  He can stop antibiotics.  Orders entered for MRI and referral entered to urology.

## 2020-03-25 ENCOUNTER — Encounter: Payer: Self-pay | Admitting: Family Medicine

## 2020-03-25 ENCOUNTER — Other Ambulatory Visit: Payer: Managed Care, Other (non HMO)

## 2020-03-28 ENCOUNTER — Ambulatory Visit: Payer: Managed Care, Other (non HMO) | Admitting: Family Medicine

## 2020-03-28 ENCOUNTER — Encounter: Payer: Self-pay | Admitting: Family Medicine

## 2020-03-28 ENCOUNTER — Other Ambulatory Visit: Payer: Self-pay

## 2020-03-28 DIAGNOSIS — N2889 Other specified disorders of kidney and ureter: Secondary | ICD-10-CM | POA: Diagnosis not present

## 2020-03-28 NOTE — Assessment & Plan Note (Signed)
Upcoming MRI to better clarify renal lesion.  Urology referral has been entered as well.  L Ribcage prominence could be related to mass or postural changes.

## 2020-03-28 NOTE — Patient Instructions (Signed)
Vitamin d: 50,000 weekly for up to 12 weeks OR 2000-5000 units daily.  We'll plan to follow up after the MRI

## 2020-03-28 NOTE — Progress Notes (Signed)
Darryl Roach - 39 y.o. male MRN [id number]  Date of birth: [birth year]  Subjective Chief Complaint  Patient presents with  . Follow-up    HPI Darryl Roach is a 39 y.o. male here today to discuss recent CT findings.  Had L sided abdominal pain previously and had CT scan to evaluate for possible diverticulitis.  No diverticulitis however 4x2cm solid mass noted on L kidney with smaller 42mm lesion on R kidney.  He has noticed protrusion over L rib cage/upper abdomen for a few years but didn't really think anything of this.  Has some mild discomfort over this area.  He does recall prior history of hematuria a few years ago and was told that it may be due to UTI.  He doesn't recall if this was ever followed up.  He has upcoming MRI for further evaluation of renal lesion at [facility].  ROS:  A comprehensive ROS was completed and negative except as noted per HPI   No Known Allergies  Past Medical History:  Diagnosis Date  . Sterile pyuria 04/14/2017    No past surgical history on file.  Social History   Socioeconomic History  . Marital status: Single    Spouse name: Not on file  . Number of children: Not on file  . Years of education: Not on file  . Highest education level: Not on file  Occupational History  . Not on file  Tobacco Use  . Smoking status: Never Smoker  . Smokeless tobacco: Never Used  Substance and Sexual Activity  . Alcohol use: Not on file  . Drug use: Not on file  . Sexual activity: Not on file  Other Topics Concern  . Not on file  Social History Narrative  . Not on file   Social Determinants of Health   Financial Resource Strain:   . Difficulty of Paying Living Expenses:   Food Insecurity:   . Worried About Charity fundraiser in the Last Year:   . Arboriculturist in the Last Year:   Transportation Needs:   . Film/video editor (Medical):   Marland Kitchen Lack of Transportation (Non-Medical):   Physical Activity:   . Days of Exercise per Week:   . Minutes of  Exercise per Session:   Stress:   . Feeling of Stress :   Social Connections:   . Frequency of Communication with Friends and Family:   . Frequency of Social Gatherings with Friends and Family:   . Attends Religious Services:   . Active Member of Clubs or Organizations:   . Attends Archivist Meetings:   Marland Kitchen Marital Status:     Family History  Problem Relation Age of Onset  . Depression Mother   . Diabetes Mother   . Alcohol abuse Father   . Alcohol abuse Brother   . Heart attack Maternal Grandmother   . Alcohol abuse Maternal Grandfather   . Heart attack Paternal Grandmother   . Cancer Paternal Grandfather        PROSTATE  . Stroke Paternal Grandfather     Health Maintenance  Topic Date Due  . COVID-19 Vaccine (1) Never done  . HIV Screening  05/02/2020 (Originally 12/22/1993)  . INFLUENZA VACCINE  06/16/2020  . TETANUS/TDAP  03/07/2028     ----------------------------------------------------------------------------------------------------------------------------------------------------------------------------------------------------------------- Physical Exam BP 133/85 (BP Location: Right Arm, Patient Position: Sitting, Cuff Size: Large)   Pulse 67   Ht 6' 0.84" (1.85 m)   Wt 258 lb 12.8 oz (117.4 kg)  SpO2 100%   BMI 34.30 kg/m   Physical Exam HENT:     Head: Normocephalic and atraumatic.  Eyes:     General: No scleral icterus. Cardiovascular:     Rate and Rhythm: Normal rate and regular rhythm.  Pulmonary:     Effort: Pulmonary effort is normal.     Breath sounds: Normal breath sounds.  Abdominal:     General: Abdomen is flat. There is no distension.     Palpations: Abdomen is soft.     Tenderness: There is abdominal tenderness (Mild ttp along L rib cage, more prominent on L side compared to R).  Musculoskeletal:     Cervical back: Neck supple.  Skin:    General: Skin is warm and dry.  Neurological:     Mental Status: He is alert.   Psychiatric:        Mood and Affect: Mood normal.        Behavior: Behavior normal.     ------------------------------------------------------------------------------------------------------------------------------------------------------------------------------------------------------------------- Assessment and Plan  Renal mass, left Upcoming MRI to better clarify renal lesion.  Urology referral has been entered as well.  L Ribcage prominence could be related to mass or postural changes.     No orders of the defined types were placed in this encounter.   No follow-ups on file.    This visit occurred during the SARS-CoV-2 public health emergency.  Safety protocols were in place, including screening questions prior to the visit, additional usage of staff PPE, and extensive cleaning of exam room while observing appropriate contact time as indicated for disinfecting solutions.

## 2020-04-12 ENCOUNTER — Telehealth: Payer: Self-pay | Admitting: Family Medicine

## 2020-04-12 DIAGNOSIS — N2889 Other specified disorders of kidney and ureter: Secondary | ICD-10-CM

## 2020-04-12 NOTE — Telephone Encounter (Signed)
Called and spoke with patient regarding MRI results for kidney.  Discussed findings suspicious for malignancy without evidence of metastasis at this time.  I had placed prior referral for urology however he has not heard anything from them.  Given number for [facility], and another referral entered.  He will let me know if having trouble scheduling.

## 2020-04-17 ENCOUNTER — Ambulatory Visit (INDEPENDENT_AMBULATORY_CARE_PROVIDER_SITE_OTHER): Payer: Managed Care, Other (non HMO)

## 2020-04-17 ENCOUNTER — Other Ambulatory Visit: Payer: Self-pay | Admitting: Urology

## 2020-04-17 DIAGNOSIS — D49512 Neoplasm of unspecified behavior of left kidney: Secondary | ICD-10-CM

## 2020-04-19 ENCOUNTER — Other Ambulatory Visit: Payer: Self-pay | Admitting: Urology

## 2020-05-17 NOTE — Patient Instructions (Addendum)
DUE TO COVID-19 ONLY ONE VISITOR IS ALLOWED TO COME WITH YOU AND STAY IN THE WAITING ROOM ONLY DURING PRE OP AND PROCEDURE DAY OF SURGERY. THE 1 VISITOR MAY VISIT WITH YOU AFTER SURGERY IN YOUR PRIVATE ROOM DURING VISITING HOURS ONLY!  YOU NEED TO HAVE A COVID 19 TEST ON: 05/28/20 @ 2:00 pm, THIS TEST MUST BE DONE BEFORE SURGERY, COME  Darryl Roach, [place] , [address].  ([place]) ONCE YOUR COVID TEST IS COMPLETED, PLEASE BEGIN THE QUARANTINE INSTRUCTIONS AS OUTLINED IN YOUR HANDOUT.                Darryl Roach   Your procedure is scheduled on: 05/31/20   Report to [facility] Main  Entrance   Report to admitting at: 10:30 AM     Call this number if you have problems the morning of surgery [phone]    Remember: Do not eat solid food :After Midnight. Clear liquids from midnight until 9:30 am.   CLEAR LIQUID DIET   Foods Allowed                                                                     Foods Excluded  Coffee and tea, regular and decaf                             liquids that you cannot  Plain Jell-O any favor except red or purple                                           see through such as: Fruit ices (not with fruit pulp)                                     milk, soups, orange juice  Iced Popsicles                                    All solid food Carbonated beverages, regular and diet                                    Cranberry, grape and apple juices Sports drinks like Gatorade Lightly seasoned clear broth or consume(fat free) Sugar, honey syrup  Sample Menu Breakfast                                Lunch                                     Supper Cranberry juice                    Beef broth  Chicken broth Jell-O                                     Grape juice                           Apple juice Coffee or tea                        Jell-O                                      Popsicle                                                 Coffee or tea                        Coffee or tea  _____________________________________________________________________   BRUSH YOUR TEETH MORNING OF SURGERY AND RINSE YOUR MOUTH OUT, NO CHEWING GUM CANDY OR MINTS.     Take these medicines the morning of surgery with A SIP OF WATER: loratadine,omeprazole,vortioxetine.                                 You may not have any metal on your body including hair pins and              piercings  Do not wear jewelry, lotions, powders or perfumes, deodorant             Men may shave face and neck.   Do not bring valuables to the hospital. Darryl Roach.  Contacts, dentures or bridgework may not be worn into surgery.  Leave suitcase in the car. After surgery it may be brought to your room.     Patients discharged the day of surgery will not be allowed to drive home. IF YOU ARE HAVING SURGERY AND GOING HOME THE SAME DAY, YOU MUST HAVE AN ADULT TO DRIVE YOU HOME AND BE WITH YOU FOR 24 HOURS. YOU MAY GO HOME BY TAXI OR UBER OR ORTHERWISE, BUT AN ADULT MUST ACCOMPANY YOU HOME AND STAY WITH YOU FOR 24 HOURS.  Name and phone number of your driver:  Special Instructions: N/A              Please read over the following fact sheets you were given: _____________________________________________________________________  [facility] - Preparing for Surgery Before surgery, you can play an important role.  Because skin is not sterile, your skin needs to be as free of germs as possible.  You can reduce the number of germs on your skin by washing with CHG (chlorahexidine gluconate) soap before surgery.  CHG is an antiseptic cleaner which kills germs and bonds with the skin to continue killing germs even after washing. Please DO NOT use if you have an allergy to CHG or antibacterial soaps.  If your skin becomes reddened/irritated stop using the CHG and inform your nurse when you arrive  at  Short Stay. Do not shave (including legs and underarms) for at least 48 hours prior to the first CHG shower.  You may shave your face/neck. Please follow these instructions carefully:  1.  Shower with CHG Soap the night before surgery and the  morning of Surgery.  2.  If you choose to wash your hair, wash your hair first as usual with your  normal  shampoo.  3.  After you shampoo, rinse your hair and body thoroughly to remove the  shampoo.                           4.  Use CHG as you would any other liquid soap.  You can apply chg directly  to the skin and wash                       Gently with a scrungie or clean washcloth.  5.  Apply the CHG Soap to your body ONLY FROM THE NECK DOWN.   Do not use on face/ open                           Wound or open sores. Avoid contact with eyes, ears mouth and genitals (private parts).                       Wash face,  Genitals (private parts) with your normal soap.             6.  Wash thoroughly, paying special attention to the area where your surgery  will be performed.  7.  Thoroughly rinse your body with warm water from the neck down.  8.  DO NOT shower/wash with your normal soap after using and rinsing off  the CHG Soap.                9.  Pat yourself dry with a clean towel.            10.  Wear clean pajamas.            11.  Place clean sheets on your bed the night of your first shower and do not  sleep with pets. Day of Surgery : Do not apply any lotions/deodorants the morning of surgery.  Please wear clean clothes to the hospital/surgery center.  FAILURE TO FOLLOW THESE INSTRUCTIONS MAY RESULT IN THE CANCELLATION OF YOUR SURGERY PATIENT SIGNATURE_________________________________  NURSE SIGNATURE__________________________________  ________________________________________________________________________

## 2020-05-21 ENCOUNTER — Encounter (HOSPITAL_COMMUNITY): Payer: Self-pay

## 2020-05-21 ENCOUNTER — Other Ambulatory Visit: Payer: Self-pay

## 2020-05-21 ENCOUNTER — Encounter (HOSPITAL_COMMUNITY)
Admission: RE | Admit: 2020-05-21 | Discharge: 2020-05-21 | Disposition: A | Discharge status: Home / Self Care | Payer: Managed Care, Other (non HMO) | Source: Ambulatory Visit | Attending: Urology | Admitting: Urology

## 2020-05-21 DIAGNOSIS — Z01818 Encounter for other preprocedural examination: Secondary | ICD-10-CM | POA: Diagnosis not present

## 2020-05-21 HISTORY — DX: Chronic kidney disease, unspecified: N18.9

## 2020-05-21 HISTORY — DX: Essential (primary) hypertension: I10

## 2020-05-21 HISTORY — DX: Anxiety disorder, unspecified: F41.9

## 2020-05-21 HISTORY — DX: Malignant (primary) neoplasm, unspecified: C80.1

## 2020-05-21 LAB — COMPREHENSIVE METABOLIC PANEL
ALT: 19 U/L (ref 0–44)
AST: 18 U/L (ref 15–41)
Albumin: 4.2 g/dL (ref 3.5–5.0)
Alkaline Phosphatase: 62 U/L (ref 38–126)
Anion gap: 8 (ref 5–15)
BUN: 12 mg/dL (ref 6–20)
CO2: 27 mmol/L (ref 22–32)
Calcium: 9 mg/dL (ref 8.9–10.3)
Chloride: 103 mmol/L (ref 98–111)
Creatinine, Ser: 0.81 mg/dL (ref 0.61–1.24)
GFR calc Af Amer: >60 mL/min (ref >60)
GFR calc non Af Amer: >60 mL/min (ref >60)
Glucose, Bld: 101 mg/dL — ABNORMAL HIGH (ref 70–99)
Potassium: 3.8 mmol/L (ref 3.5–5.1)
Sodium: 138 mmol/L (ref 135–145)
Total Bilirubin: 0.4 mg/dL (ref 0.3–1.2)
Total Protein: 7.4 g/dL (ref 6.5–8.1)

## 2020-05-21 LAB — CBC
HCT: 46.3 % (ref 39.0–52.0)
Hemoglobin: 15.3 g/dL (ref 13.0–17.0)
MCH: 29.8 pg (ref 26.0–34.0)
MCHC: 33 g/dL (ref 30.0–36.0)
MCV: 90.1 fL (ref 80.0–100.0)
Platelets: 307 10*3/uL (ref 150–400)
RBC: 5.14 MIL/uL (ref 4.22–5.81)
RDW: 12.9 % (ref 11.5–15.5)
WBC: 9.2 10*3/uL (ref 4.0–10.5)
nRBC: 0 % (ref 0.0–0.2)

## 2020-05-21 NOTE — Progress Notes (Addendum)
COVID Vaccine Completed:yes Date COVID Vaccine completed:02/22/20 COVID vaccine manufacturer: [employer]   PCP - Dr. Emeterio Reeve  Cardiologist - No  Chest x-ray - 04/18/20. EPIC EKG -  Stress Test -  ECHO -  Cardiac Cath -   Sleep Study -  CPAP -   Fasting Blood Sugar -  Checks Blood Sugar _____ times a day  Blood Thinner Instructions: Aspirin Instructions: Last Dose:  Anesthesia review:   Patient denies shortness of breath, fever, cough and chest pain at PAT appointment   Patient verbalized understanding of instructions that were given to them at the PAT appointment. Patient was also instructed that they will need to review over the PAT instructions again at home before surgery.

## 2020-05-28 ENCOUNTER — Other Ambulatory Visit (HOSPITAL_COMMUNITY)
Admission: RE | Admit: 2020-05-28 | Discharge: 2020-05-28 | Disposition: A | Discharge status: Home / Self Care | Payer: Managed Care, Other (non HMO) | Source: Ambulatory Visit | Attending: Urology | Admitting: Urology

## 2020-05-28 DIAGNOSIS — Z01812 Encounter for preprocedural laboratory examination: Secondary | ICD-10-CM | POA: Insufficient documentation

## 2020-05-28 DIAGNOSIS — Z20822 Contact with and (suspected) exposure to covid-19: Secondary | ICD-10-CM | POA: Insufficient documentation

## 2020-05-28 LAB — SARS CORONAVIRUS 2 (TAT 6-24 HRS): SARS Coronavirus 2: NEGATIVE

## 2020-05-31 ENCOUNTER — Inpatient Hospital Stay (HOSPITAL_COMMUNITY): Payer: Managed Care, Other (non HMO) | Admitting: Certified Registered Nurse Anesthetist

## 2020-05-31 ENCOUNTER — Inpatient Hospital Stay (HOSPITAL_COMMUNITY)
Admission: RE | Admit: 2020-05-31 | Discharge: 2020-06-03 | DRG: 658 | Disposition: A | Discharge status: Home / Self Care | Payer: Managed Care, Other (non HMO) | Attending: Urology | Admitting: Urology

## 2020-05-31 ENCOUNTER — Encounter (HOSPITAL_COMMUNITY): Payer: Self-pay | Admitting: Urology

## 2020-05-31 ENCOUNTER — Encounter (HOSPITAL_COMMUNITY)
Admission: RE | Disposition: A | Discharge status: Home / Self Care | Payer: Self-pay | Source: Other Acute Inpatient Hospital | Attending: Urology

## 2020-05-31 DIAGNOSIS — R112 Nausea with vomiting, unspecified: Secondary | ICD-10-CM | POA: Diagnosis not present

## 2020-05-31 DIAGNOSIS — E669 Obesity, unspecified: Secondary | ICD-10-CM | POA: Diagnosis present

## 2020-05-31 DIAGNOSIS — Z302 Encounter for sterilization: Secondary | ICD-10-CM | POA: Diagnosis not present

## 2020-05-31 DIAGNOSIS — C642 Malignant neoplasm of left kidney, except renal pelvis: Principal | ICD-10-CM | POA: Diagnosis present

## 2020-05-31 DIAGNOSIS — Z6834 Body mass index (BMI) 34.0-34.9, adult: Secondary | ICD-10-CM

## 2020-05-31 DIAGNOSIS — I10 Essential (primary) hypertension: Secondary | ICD-10-CM | POA: Diagnosis present

## 2020-05-31 DIAGNOSIS — Z20822 Contact with and (suspected) exposure to covid-19: Secondary | ICD-10-CM | POA: Diagnosis present

## 2020-05-31 DIAGNOSIS — Z79899 Other long term (current) drug therapy: Secondary | ICD-10-CM

## 2020-05-31 DIAGNOSIS — N2889 Other specified disorders of kidney and ureter: Secondary | ICD-10-CM | POA: Diagnosis present

## 2020-05-31 DIAGNOSIS — K219 Gastro-esophageal reflux disease without esophagitis: Secondary | ICD-10-CM | POA: Diagnosis present

## 2020-05-31 HISTORY — PX: ROBOT ASSISTED LAPAROSCOPIC NEPHRECTOMY: SHX5140

## 2020-05-31 HISTORY — PX: VASECTOMY: SHX75

## 2020-05-31 LAB — HEMOGLOBIN AND HEMATOCRIT, BLOOD
HCT: 45.7 % (ref 39.0–52.0)
Hemoglobin: 15.4 g/dL (ref 13.0–17.0)

## 2020-05-31 LAB — TYPE AND SCREEN
ABO/RH(D): O POS
Antibody Screen: NEGATIVE

## 2020-05-31 LAB — ABO/RH: ABO/RH(D): O POS

## 2020-05-31 SURGERY — NEPHRECTOMY, RADICAL, ROBOT-ASSISTED, LAPAROSCOPIC, ADULT
Anesthesia: General

## 2020-05-31 MED ORDER — HYDROCODONE-ACETAMINOPHEN 5-325 MG PO TABS: 1.0000 | ORAL_TABLET | Freq: Four times a day (QID) | ORAL | 0 refills | Status: DC | PRN

## 2020-05-31 MED ORDER — CEFAZOLIN SODIUM-DEXTROSE 1-4 GM/50ML-% IV SOLN
1.0000 g | Freq: Three times a day (TID) | INTRAVENOUS | Status: AC
  Administered 2020-05-31 – 2020-06-01 (×2): 1 g via INTRAVENOUS
  Filled 2020-05-31 (×2): qty 50

## 2020-05-31 MED ORDER — DEXTROSE-NACL 5-0.45 % IV SOLN: INTRAVENOUS | Status: DC

## 2020-05-31 MED ORDER — HYDRALAZINE HCL 20 MG/ML IJ SOLN: 10.0000 mg | INTRAMUSCULAR | Status: DC | PRN

## 2020-05-31 MED ORDER — FENTANYL CITRATE (PF) 100 MCG/2ML IJ SOLN
INTRAMUSCULAR | Status: AC
  Administered 2020-05-31: 25 ug via INTRAVENOUS
  Filled 2020-05-31: qty 2

## 2020-05-31 MED ORDER — GLYCOPYRROLATE 0.2 MG/ML IJ SOLN
INTRAMUSCULAR | Status: DC | PRN
Start: 2020-05-31 — End: 2020-05-31
  Administered 2020-05-31: .2 mg via INTRAVENOUS

## 2020-05-31 MED ORDER — CEFAZOLIN SODIUM-DEXTROSE 2-4 GM/100ML-% IV SOLN
2.0000 g | Freq: Once | INTRAVENOUS | Status: AC
  Administered 2020-05-31: 2 g via INTRAVENOUS

## 2020-05-31 MED ORDER — VORTIOXETINE HBR 5 MG PO TABS
10.0000 mg | ORAL_TABLET | Freq: Every day | ORAL | Status: DC
  Administered 2020-06-01 – 2020-06-03 (×3): 10 mg via ORAL
  Filled 2020-05-31 (×3): qty 2

## 2020-05-31 MED ORDER — BUPIVACAINE LIPOSOME 1.3 % IJ SUSP
20.0000 mL | Freq: Once | INTRAMUSCULAR | Status: AC
  Administered 2020-05-31: 20 mL
  Filled 2020-05-31: qty 20

## 2020-05-31 MED ORDER — PROMETHAZINE HCL 12.5 MG PO TABS
12.5000 mg | ORAL_TABLET | ORAL | 0 refills | Status: DC | PRN
Start: 2020-05-31 — End: 2020-11-28

## 2020-05-31 MED ORDER — ONDANSETRON HCL 4 MG/2ML IJ SOLN
INTRAMUSCULAR | Status: DC | PRN
  Administered 2020-05-31: 4 mg via INTRAVENOUS

## 2020-05-31 MED ORDER — HYDROCODONE-ACETAMINOPHEN 5-325 MG PO TABS: 1.0000 | ORAL_TABLET | ORAL | Status: DC | PRN

## 2020-05-31 MED ORDER — LIDOCAINE 2% (20 MG/ML) 5 ML SYRINGE
INTRAMUSCULAR | Status: DC | PRN
  Administered 2020-05-31: 60 mg via INTRAVENOUS

## 2020-05-31 MED ORDER — DOCUSATE SODIUM 100 MG PO CAPS
100.0000 mg | ORAL_CAPSULE | Freq: Two times a day (BID) | ORAL | Status: DC
  Administered 2020-06-01 – 2020-06-03 (×5): 100 mg via ORAL
  Filled 2020-05-31 (×4): qty 1

## 2020-05-31 MED ORDER — OXYCODONE HCL 5 MG/5ML PO SOLN: 5.0000 mg | Freq: Once | ORAL | Status: DC | PRN

## 2020-05-31 MED ORDER — BUPIVACAINE HCL 0.25 % IJ SOLN
INTRAMUSCULAR | Status: AC
  Filled 2020-05-31: qty 1

## 2020-05-31 MED ORDER — MIDAZOLAM HCL 5 MG/5ML IJ SOLN
INTRAMUSCULAR | Status: DC | PRN
  Administered 2020-05-31: 2 mg via INTRAVENOUS

## 2020-05-31 MED ORDER — STERILE WATER FOR IRRIGATION IR SOLN
Status: DC | PRN
  Administered 2020-05-31: 1000 mL

## 2020-05-31 MED ORDER — ROCURONIUM BROMIDE 10 MG/ML (PF) SYRINGE
PREFILLED_SYRINGE | INTRAVENOUS | Status: AC
  Filled 2020-05-31: qty 10

## 2020-05-31 MED ORDER — HYDROMORPHONE HCL 1 MG/ML IJ SOLN
0.5000 mg | INTRAMUSCULAR | Status: DC | PRN
  Administered 2020-05-31: 1 mg via INTRAVENOUS
  Administered 2020-06-01: 0.5 mg via INTRAVENOUS
  Administered 2020-06-01 (×2): 1 mg via INTRAVENOUS
  Administered 2020-06-01 (×2): 0.5 mg via INTRAVENOUS
  Administered 2020-06-02 (×5): 1 mg via INTRAVENOUS
  Administered 2020-06-03: 0.5 mg via INTRAVENOUS
  Filled 2020-05-31 (×12): qty 1

## 2020-05-31 MED ORDER — FENTANYL CITRATE (PF) 100 MCG/2ML IJ SOLN
25.0000 ug | INTRAMUSCULAR | Status: DC | PRN
  Administered 2020-05-31: 50 ug via INTRAVENOUS
  Administered 2020-05-31: 25 ug via INTRAVENOUS

## 2020-05-31 MED ORDER — LACTATED RINGERS IV SOLN: INTRAVENOUS | Status: DC

## 2020-05-31 MED ORDER — LIDOCAINE 2% (20 MG/ML) 5 ML SYRINGE
INTRAMUSCULAR | Status: AC
  Filled 2020-05-31: qty 5

## 2020-05-31 MED ORDER — CHLORHEXIDINE GLUCONATE 0.12 % MT SOLN
15.0000 mL | Freq: Once | OROMUCOSAL | Status: AC
  Administered 2020-05-31: 15 mL via OROMUCOSAL

## 2020-05-31 MED ORDER — SODIUM CHLORIDE (PF) 0.9 % IJ SOLN
INTRAMUSCULAR | Status: AC
  Filled 2020-05-31: qty 20

## 2020-05-31 MED ORDER — OXYCODONE HCL 5 MG PO TABS: 5.0000 mg | ORAL_TABLET | Freq: Once | ORAL | Status: DC | PRN

## 2020-05-31 MED ORDER — CEFAZOLIN SODIUM-DEXTROSE 2-4 GM/100ML-% IV SOLN
INTRAVENOUS | Status: AC
  Filled 2020-05-31: qty 100

## 2020-05-31 MED ORDER — FENTANYL CITRATE (PF) 100 MCG/2ML IJ SOLN
INTRAMUSCULAR | Status: AC
  Filled 2020-05-31: qty 2

## 2020-05-31 MED ORDER — FENTANYL CITRATE (PF) 100 MCG/2ML IJ SOLN
INTRAMUSCULAR | Status: DC | PRN
  Administered 2020-05-31: 150 ug via INTRAVENOUS
  Administered 2020-05-31 (×2): 100 ug via INTRAVENOUS

## 2020-05-31 MED ORDER — DOCUSATE SODIUM 100 MG PO CAPS: 100.0000 mg | ORAL_CAPSULE | Freq: Two times a day (BID) | ORAL | Status: DC

## 2020-05-31 MED ORDER — DIPHENHYDRAMINE HCL 50 MG/ML IJ SOLN: 12.5000 mg | Freq: Four times a day (QID) | INTRAMUSCULAR | Status: DC | PRN

## 2020-05-31 MED ORDER — PANTOPRAZOLE SODIUM 40 MG PO TBEC
40.0000 mg | DELAYED_RELEASE_TABLET | Freq: Every day | ORAL | Status: DC
  Administered 2020-06-01 – 2020-06-03 (×3): 40 mg via ORAL
  Filled 2020-05-31 (×3): qty 1

## 2020-05-31 MED ORDER — BELLADONNA ALKALOIDS-OPIUM 16.2-60 MG RE SUPP: 1.0000 | Freq: Four times a day (QID) | RECTAL | Status: DC | PRN

## 2020-05-31 MED ORDER — MIDAZOLAM HCL 2 MG/2ML IJ SOLN
INTRAMUSCULAR | Status: AC
  Filled 2020-05-31: qty 2

## 2020-05-31 MED ORDER — DIPHENHYDRAMINE HCL 12.5 MG/5ML PO ELIX: 12.5000 mg | ORAL_SOLUTION | Freq: Four times a day (QID) | ORAL | Status: DC | PRN

## 2020-05-31 MED ORDER — PROMETHAZINE HCL 25 MG/ML IJ SOLN: 6.2500 mg | INTRAMUSCULAR | Status: DC | PRN

## 2020-05-31 MED ORDER — ACETAMINOPHEN 325 MG PO TABS: 650.0000 mg | ORAL_TABLET | ORAL | Status: DC | PRN

## 2020-05-31 MED ORDER — ROCURONIUM BROMIDE 10 MG/ML (PF) SYRINGE
PREFILLED_SYRINGE | INTRAVENOUS | Status: DC | PRN
  Administered 2020-05-31 (×2): 30 mg via INTRAVENOUS
  Administered 2020-05-31: 70 mg via INTRAVENOUS
  Administered 2020-05-31: 20 mg via INTRAVENOUS

## 2020-05-31 MED ORDER — LACTATED RINGERS IR SOLN
Status: DC | PRN
  Administered 2020-05-31: 1000 mL

## 2020-05-31 MED ORDER — PROPOFOL 10 MG/ML IV BOLUS
INTRAVENOUS | Status: AC
  Filled 2020-05-31: qty 20

## 2020-05-31 MED ORDER — ORAL CARE MOUTH RINSE: 15.0000 mL | Freq: Once | OROMUCOSAL | Status: AC

## 2020-05-31 MED ORDER — BUPIVACAINE HCL (PF) 0.25 % IJ SOLN
INTRAMUSCULAR | Status: DC | PRN
  Administered 2020-05-31: 10 mL

## 2020-05-31 MED ORDER — FENTANYL CITRATE (PF) 250 MCG/5ML IJ SOLN
INTRAMUSCULAR | Status: AC
  Filled 2020-05-31: qty 5

## 2020-05-31 MED ORDER — HYDROMORPHONE HCL 1 MG/ML IJ SOLN
INTRAMUSCULAR | Status: DC | PRN
  Administered 2020-05-31 (×2): 1 mg via INTRAVENOUS

## 2020-05-31 MED ORDER — SUGAMMADEX SODIUM 200 MG/2ML IV SOLN
INTRAVENOUS | Status: DC | PRN
  Administered 2020-05-31: 200 mg via INTRAVENOUS

## 2020-05-31 MED ORDER — BACITRACIN-NEOMYCIN-POLYMYXIN 400-5-5000 EX OINT: 1.0000 "application " | TOPICAL_OINTMENT | Freq: Three times a day (TID) | CUTANEOUS | Status: DC | PRN

## 2020-05-31 MED ORDER — GLYCOPYRROLATE PF 0.2 MG/ML IJ SOSY
PREFILLED_SYRINGE | INTRAMUSCULAR | Status: AC
  Filled 2020-05-31: qty 1

## 2020-05-31 MED ORDER — PROPOFOL 10 MG/ML IV BOLUS
INTRAVENOUS | Status: DC | PRN
  Administered 2020-05-31: 200 mg via INTRAVENOUS

## 2020-05-31 MED ORDER — DEXAMETHASONE SODIUM PHOSPHATE 4 MG/ML IJ SOLN
INTRAMUSCULAR | Status: DC | PRN
  Administered 2020-05-31: 10 mg via INTRAVENOUS

## 2020-05-31 MED ORDER — DOCUSATE SODIUM 100 MG PO CAPS
100.0000 mg | ORAL_CAPSULE | Freq: Two times a day (BID) | ORAL | 0 refills | Status: DC
Start: 2020-05-31 — End: 2020-05-31

## 2020-05-31 MED ORDER — BUPIVACAINE-EPINEPHRINE (PF) 0.25% -1:200000 IJ SOLN
INTRAMUSCULAR | Status: AC
  Filled 2020-05-31: qty 30

## 2020-05-31 MED ORDER — HYDROMORPHONE HCL 2 MG/ML IJ SOLN
INTRAMUSCULAR | Status: AC
  Filled 2020-05-31: qty 1

## 2020-05-31 MED ORDER — ONDANSETRON HCL 4 MG/2ML IJ SOLN
4.0000 mg | INTRAMUSCULAR | Status: DC | PRN
  Administered 2020-06-01 – 2020-06-02 (×6): 4 mg via INTRAVENOUS
  Filled 2020-05-31 (×7): qty 2

## 2020-05-31 SURGICAL SUPPLY — 58 items
BAG LAPAROSCOPIC 12 15 PORT 16 (BASKET) ×2 IMPLANT
BAG RETRIEVAL 12/15 (BASKET) ×3
BLADE SURG 15 STRL LF DISP TIS (BLADE) ×2 IMPLANT
BLADE SURG 15 STRL SS (BLADE) ×1
CHLORAPREP W/TINT 26 (MISCELLANEOUS) ×3 IMPLANT
CLIP VESOCCLUDE MED 6/CT (CLIP) ×3 IMPLANT
CLIP VESOLOCK LG 6/CT PURPLE (CLIP) ×6 IMPLANT
CLIP VESOLOCK MED LG 6/CT (CLIP) ×3 IMPLANT
CLIP VESOLOCK XL 6/CT (CLIP) ×3 IMPLANT
COVER SURGICAL LIGHT HANDLE (MISCELLANEOUS) ×3 IMPLANT
COVER TIP SHEARS 8 DVNC (MISCELLANEOUS) ×2 IMPLANT
COVER TIP SHEARS 8MM DA VINCI (MISCELLANEOUS) ×1
COVER WAND RF STERILE (DRAPES) IMPLANT
CUTTER ECHEON FLEX ENDO 45 340 (ENDOMECHANICALS) ×3 IMPLANT
DECANTER SPIKE VIAL GLASS SM (MISCELLANEOUS) ×3 IMPLANT
DERMABOND ADVANCED (GAUZE/BANDAGES/DRESSINGS) ×1
DERMABOND ADVANCED .7 DNX12 (GAUZE/BANDAGES/DRESSINGS) ×2 IMPLANT
DRAPE ARM DVNC X/XI (DISPOSABLE) ×8 IMPLANT
DRAPE COLUMN DVNC XI (DISPOSABLE) ×2 IMPLANT
DRAPE DA VINCI XI ARM (DISPOSABLE) ×4
DRAPE DA VINCI XI COLUMN (DISPOSABLE) ×1
DRAPE INCISE IOBAN 66X45 STRL (DRAPES) ×3 IMPLANT
DRAPE LAPAROTOMY T 98X78 PEDS (DRAPES) ×3 IMPLANT
DRAPE SHEET LG 3/4 BI-LAMINATE (DRAPES) ×3 IMPLANT
ELECT NEEDLE TIP 2.8 STRL (NEEDLE) ×3 IMPLANT
ELECT REM PT RETURN 15FT ADLT (MISCELLANEOUS) ×3 IMPLANT
GAUZE 4X4 16PLY RFD (DISPOSABLE) ×3 IMPLANT
GLOVE BIO SURGEON STRL SZ 6.5 (GLOVE) ×6 IMPLANT
GLOVE BIOGEL M STRL SZ7.5 (GLOVE) ×9 IMPLANT
GOWN STRL REUS W/TWL LRG LVL3 (GOWN DISPOSABLE) ×12 IMPLANT
GOWN STRL REUS W/TWL XL LVL3 (GOWN DISPOSABLE) ×6 IMPLANT
HEMOSTAT SURGICEL 4X8 (HEMOSTASIS) ×3 IMPLANT
IRRIG SUCT STRYKERFLOW 2 WTIP (MISCELLANEOUS) ×3
IRRIGATION SUCT STRKRFLW 2 WTP (MISCELLANEOUS) ×2 IMPLANT
KIT BASIN OR (CUSTOM PROCEDURE TRAY) ×3 IMPLANT
KIT TURNOVER KIT A (KITS) IMPLANT
NEEDLE HYPO 25X1 1.5 SAFETY (NEEDLE) ×3 IMPLANT
NEEDLE INSUFFLATION 14GA 120MM (NEEDLE) ×3 IMPLANT
NS IRRIG 1000ML POUR BTL (IV SOLUTION) ×3 IMPLANT
PACK GENERAL/GYN (CUSTOM PROCEDURE TRAY) ×3 IMPLANT
PENCIL SMOKE EVACUATOR (MISCELLANEOUS) IMPLANT
PROTECTOR NERVE ULNAR (MISCELLANEOUS) ×6 IMPLANT
SEAL CANN UNIV 5-8 DVNC XI (MISCELLANEOUS) ×8 IMPLANT
SEAL XI 5MM-8MM UNIVERSAL (MISCELLANEOUS) ×4
SET TUBE SMOKE EVAC HIGH FLOW (TUBING) ×3 IMPLANT
SOLUTION ELECTROLUBE (MISCELLANEOUS) ×3 IMPLANT
STAPLE RELOAD 45 WHT (STAPLE) ×4 IMPLANT
STAPLE RELOAD 45MM WHITE (STAPLE) ×2
SUT MNCRL AB 4-0 PS2 18 (SUTURE) ×6 IMPLANT
SUT PDS AB 0 CT1 36 (SUTURE) ×6 IMPLANT
SUT VICRYL 0 UR6 27IN ABS (SUTURE) ×6 IMPLANT
TOWEL OR 17X26 10 PK STRL BLUE (TOWEL DISPOSABLE) ×3 IMPLANT
TOWEL OR NON WOVEN STRL DISP B (DISPOSABLE) ×3 IMPLANT
TRAY FOLEY MTR SLVR 16FR STAT (SET/KITS/TRAYS/PACK) ×3 IMPLANT
TRAY LAPAROSCOPIC (CUSTOM PROCEDURE TRAY) ×3 IMPLANT
TROCAR BLADELESS OPT 5 100 (ENDOMECHANICALS) IMPLANT
TROCAR XCEL 12X100 BLDLESS (ENDOMECHANICALS) ×3 IMPLANT
WATER STERILE IRR 1000ML POUR (IV SOLUTION) ×3 IMPLANT

## 2020-05-31 NOTE — Anesthesia Preprocedure Evaluation (Addendum)
Anesthesia Evaluation  Patient identified by MRN, date of birth, ID band Patient awake    Reviewed: Allergy & Precautions, NPO status , Patient's Chart, lab work & pertinent test results  History of Anesthesia Complications Negative for: history of anesthetic complications  Airway Mallampati: II  TM Distance: >3 FB Neck ROM: Full    Dental  (+) Dental Advisory Given, Teeth Intact   Pulmonary neg pulmonary ROS,    Pulmonary exam normal        Cardiovascular hypertension, Pt. on medications Normal cardiovascular exam     Neuro/Psych PSYCHIATRIC DISORDERS Anxiety Depression negative neurological ROS     GI/Hepatic Neg liver ROS, GERD  Medicated and Controlled,  Endo/Other   Obesity   Renal/GU CRFRenal disease (left renal mass)     Musculoskeletal negative musculoskeletal ROS (+)   Abdominal   Peds  Hematology negative hematology ROS (+)   Anesthesia Other Findings Covid test negative   Reproductive/Obstetrics                            Anesthesia Physical Anesthesia Plan  ASA: III  Anesthesia Plan: General   Post-op Pain Management:    Induction: Intravenous  PONV Risk Score and Plan: 3 and Treatment may vary due to age or medical condition, Ondansetron, Dexamethasone and Midazolam  Airway Management Planned: Oral ETT  Additional Equipment: None  Intra-op Plan:   Post-operative Plan: Extubation in OR  Informed Consent: I have reviewed the patients History and Physical, chart, labs and discussed the procedure including the risks, benefits and alternatives for the proposed anesthesia with the patient or authorized representative who has indicated his/her understanding and acceptance.     Dental advisory given  Plan Discussed with: CRNA and Anesthesiologist  Anesthesia Plan Comments:        Anesthesia Quick Evaluation

## 2020-05-31 NOTE — Op Note (Signed)
Operative Note  Preoperative diagnosis:  1.  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  Encounter for sterilization  Postoperative diagnosis: 1.  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  Encounter for sterilization  Procedure(s): 1.  Vasectomy 2.  Robot-assisted laparoscopic left radical nephrectomy  Surgeon: Ellison Hughs, MD  Assistants:   1.  Debbrah Alar, [facility]  An assistant was required for this surgical procedure.  The duties of the assistant included but were not limited to suctioning, passing suture, camera manipulation, retraction.  This procedure would not be able to be performed without an Environmental consultant.  2.  Carmie Kanner, MD PGY4  Anesthesia:  General  Complications:  None  EBL: 50 mL  Specimens: 1.  Left kidney and adrenal gland  Drains/Catheters: 1.  16 French Foley catheter  Intraoperative findings:   1. The left renal hilum was hemostatic following staple ligation 2. No gross evidence of tumor extension beyond her of his fascia  Indication:  Darryl Roach is a 39 y.o. male with a left-sided Bosniak 4 renal cyst with features concerning for renal cell carcinoma that was identified on recent CT and MRI.  He would also like to pursue a vasectomy for sterilization.  He has been consented for the above procedures, voices understanding and wishes to proceed.  Description of procedure:  After informed consent was obtained, the patient was brought to the operating room and general anesthesia was administered.  The patient was placed in the supine position and prepped and draped in usual sterile fashion.  A timeout was then performed.  A 1 cm vertical incision was then made in the midportion along the median raphae.  The right-sided vas deferens was then isolated and grasped with a penetrating towel clamp and brought to the level of the incision.  A scalpel was then used to incise the  overlying perivasal tissue, completely isolating the vas deferens.  The adjacent neurovascular bundle was then carefully dissected away from the vas deferens, which was then clipped on both sides and a 1 cm segment was excised.  The left vas deferens was then delivered in a similar fashion, clipping both sides with surgical clips and excising a 1 cm segment.  The scrotal incision was then reapproximated using an interrupted 3-0 chromic suture.  The patient was then placed in the right lateral decubitus position and prepped and draped in usual sterile fashion.  A timeout was performed.  An 8 mm incision was then made lateral to the left rectus muscle at the level of the left 12th rib.  Abdominal access was obtained via a Veress needle.  The abdominal cavity was then insufflated up to 15 mmHg.  An 8 mm port was then introduced into the abdominal cavity.  Inspection of the port entry site by the robotic camera revealed no adjacent organ injury.  We then placed 3 additional 8 mm robotic ports to triangulate the left renal hilum.  A 12 mm assistant port was then placed between the carmera port and 3rd robotic arm.  The white line of Toldt along the descending colon was incised sharply and the colon, along with its mesocolonic fat, was reflected medially until the aorta was identified.  We then made a small window adjacent to the lower pole of the left kidney, identifying the left psoas muscle, left ureter and left gonadal vein.  The left ureter and gonadal vein were then reflected  anteriorly allowing [place] to then incised the perihilar attachments using electrocautery.  We encountered a small lumbar vein adjacent to the insertion of the left gonadal vein into the left renal vein.  This lumbar vein was ligated with hemo-lock clips in 2 places and incised sharply.  This provided [place] excellent exposure to the left renal hilum.    A 45 mm endovascular stapler was then used to ligate the left renal artery and then the left  renal vein, achieving excellent hemostasis.  The remaining peri-renal attachments were then excised using accommodation of blunt dissection and electrocautery.  The left adrenal gland was taken with the left kidney due to it proximity to the perihilar mass.  The endovascular stapler was then used to ligate the left gonadal vein and left ureter.  Once the kidney was freely mobile, it was placed in Endo Catch bag to be be retrieved at the conclusion of the case.  The robot was then de-docked.  A left lower quadrant Gibson incision was then made and the mass was removed within the Endo Catch bag.  The fascia within the midline assistant port was then closed using an interrupted 0 Vicryl suture.  The fascia of the internal and external oblique was then closed using a 0 PDS suture in a running fashion.  The subcutaneous tissue within the [facility] incision was then closed using a running 0 Vicryl suture.  All skin incisions were then closed using 4-0 Monocryl and then dressed with Dermabond.  The patient tolerated the procedure well and was transferred to the postanesthesia in stable condition.    Plan:  Monitor on the floor overnight.

## 2020-05-31 NOTE — Anesthesia Postprocedure Evaluation (Signed)
Anesthesia Post Note  Patient: Market researcher  Procedure(s) Performed: XI ROBOTIC ASSISTED LAPAROSCOPIC RADICAL NEPHRECTOMY (Left ) VASECTOMY (N/A )     Patient location during evaluation: PACU Anesthesia Type: General Level of consciousness: awake and alert Pain management: pain level controlled Vital Signs Assessment: post-procedure vital signs reviewed and stable Respiratory status: spontaneous breathing, nonlabored ventilation, respiratory function stable and patient connected to nasal cannula oxygen Cardiovascular status: blood pressure returned to baseline and stable Postop Assessment: no apparent nausea or vomiting Anesthetic complications: no   No complications documented.  Last Vitals:  Vitals:   05/31/20 1645 05/31/20 1700  BP: (!) 157/101 (!) 158/110  Pulse: (!) 47 63  Resp: (!) 8 13  Temp:    SpO2: 100% 100%    Last Pain:  Vitals:   05/31/20 1700  TempSrc:   PainSc: 2                  Barnet Glasgow

## 2020-05-31 NOTE — Discharge Instructions (Signed)
1.  Activity:  You are encouraged to ambulate frequently (about every hour during waking hours) to help prevent blood clots from forming in your legs or lungs.  However, you should not engage in any heavy lifting (> 10-15 lbs), strenuous activity, or straining. °2. Diet: You should advance your diet as instructed by your physician.  It will be normal to have some bloating, nausea, and abdominal discomfort intermittently. °3. Prescriptions:  You will be provided a prescription for pain medication to take as needed.  If your pain is not severe enough to require the prescription pain medication, you may take extra strength Tylenol instead which will have less side effects.  You should also take a prescribed stool softener to avoid straining with bowel movements as the prescription pain medication may constipate you. °4. Incisions: You may remove your dressing bandages 48 hours after surgery if not removed in the hospital.  You will either have some small staples or special tissue glue at each of the incision sites. Once the bandages are removed (if present), the incisions may stay open to air.  You may start showering (but not soaking or bathing in water) the 2nd day after surgery and the incisions simply need to be patted dry after the shower.  No additional care is needed. °5. What to call us about: You should call the office ([phone]) if you develop fever > 101 or develop persistent vomiting. ° ° °You may resume Mobic, aspirin, advil, aleve, vitamins, and supplements 7 days after surgery. °

## 2020-05-31 NOTE — H&P (Addendum)
Urology Preoperative H&P   Chief Complaint: Left renal mass  History of Present Illness: Darryl Roach is a 39 y.o. 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 The lesion was first identified on CT from 03/20/20 during a workup for acute abdominal pain. He reports intermittent episodes of generalized abdominal and left flank pain for the past several months. He reports a prior skateboarding injury onto his left flank that resulted in rib fractures and contusions. He has a possible history of gross hematuria, but the patient admits that it could have been concentrated urine. UA from 10/2019 showed no signs of microscopic hematuria. He has no personal/family history of GU cancers. He admits to smoking marijuana on occasion, but denies tobacco use. He was contemplating a vasectomy at the end of last year, but never got around to scheduling the procedure.    Past Medical History:  Diagnosis Date  . Anxiety   . Cancer ([place])    kidney  . Chronic kidney disease   . Hypertension   . Sterile pyuria 04/14/2017    Past Surgical History:  Procedure Laterality Date  . NO PAST SURGERIES      Allergies: No Known Allergies  Family History  Problem Relation Age of Onset  . Depression Mother   . Diabetes Mother   . Alcohol abuse Father   . Alcohol abuse Brother   . Heart attack Maternal Grandmother   . Alcohol abuse Maternal Grandfather   . Heart attack Paternal Grandmother   . Cancer Paternal Grandfather        PROSTATE  . Stroke Paternal Grandfather     Social History:  reports that he has never smoked. He has never used smokeless tobacco. He reports previous alcohol use. He reports current drug use. Drug: Marijuana.  ROS: A complete review of systems was performed.  All systems are negative except for pertinent findings as noted.  Physical Exam:  Vital signs in last 24 hours: Temp:  [98.8 F (37.1 C)] 98.8 F (37.1 C) (07/16  1038) Pulse Rate:  [61] 61 (07/16 1038) Resp:  [16] 16 (07/16 1038) BP: (139)/(94) 139/94 (07/16 1038) SpO2:  [97 %] 97 % (07/16 1038) Weight:  [118.8 kg] 118.8 kg (07/16 1034) Constitutional:  Alert and oriented, No acute distress Cardiovascular: Regular rate and rhythm, No JVD Respiratory: Normal respiratory effort, Lungs clear bilaterally GI: Abdomen is soft, nontender, nondistended, no abdominal masses GU: No CVA tenderness Lymphatic: No lymphadenopathy Neurologic: Grossly intact, no focal deficits Psychiatric: Normal mood and affect  Laboratory Data:  No results for input(s): WBC, HGB, HCT, PLT in the last 72 hours.  No results for input(s): NA, K, CL, GLUCOSE, BUN, CALCIUM, CREATININE in the last 72 hours.  Invalid input(s): CO3   Results for orders placed or performed during the hospital encounter of 05/31/20 (from the past 24 hour(s))  ABO/Rh     Status: None   Collection Time: 05/31/20  5:00 AM  Result Value Ref Range   ABO/RH(D)      O POS Performed at [facility] [address], [place], [place] [address]    Recent Results (from the past 240 hour(s))  SARS CORONAVIRUS 2 (TAT 6-24 HRS) Nasopharyngeal Nasopharyngeal Swab     Status: None   Collection Time: 05/28/20  2:03 PM   Specimen: Nasopharyngeal Swab  Result Value Ref Range Status   SARS Coronavirus 2 NEGATIVE NEGATIVE Final  Comment: (NOTE) SARS-CoV-2 target nucleic acids are NOT DETECTED.  The SARS-CoV-2 RNA is generally detectable in upper and lower respiratory specimens during the acute phase of infection. Negative results do not preclude SARS-CoV-2 infection, do not rule out co-infections with other pathogens, and should not be used as the sole basis for treatment or other patient management decisions. Negative results must be combined with clinical observations, patient history, and epidemiological information. The expected result is Negative.  Fact Sheet for  Patients: SugarRoll.be  Fact Sheet for Healthcare Providers: https://www.woods-mathews.com/  This test is not yet approved or cleared by the [place] FDA and  has been authorized for detection and/or diagnosis of SARS-CoV-2 by FDA under an Emergency Use Authorization (EUA). This EUA will remain  in effect (meaning this test can be used) for the duration of the COVID-19 declaration under Se ction 564(b)(1) of the Act, 21 U.S.C. section 360bbb-3(b)(1), unless the authorization is terminated or revoked sooner.  Performed at [facility], [place] [address] [place], [place] [address]     Renal Function: No results for input(s): CREATININE in the last 168 hours. Estimated Creatinine Clearance: 162.1 mL/min (by C-G formula based on SCr of 0.81 mg/dL).  Radiologic Imaging: MR ABDOMEN WITH AND WITHOUT CONTRAST   INDICATION: N28.89 Left renal mass N28.89 Other specified disorders of kidney and ureter   COMPARISON: None.   TECHNIQUE: Multi-planar, multi-sequence MR images of the entire abdomen were obtained before and after intravenous administration of gadolinium-based contrast.   FINDINGS:   Lower Chest:  Heart: Normal size. No pericardial effusion.  Lungs/Pleura: No masses, consolidations, or effusions.   Abdomen:  Liver: Normal size. 8 mm T2 hyperintense, T1 hypointense, nonenhancing lesion in segment 4A, likely simple hepatic cyst..  Gallbladder: Normal. No stones or inflammatory changes.  Biliary: No obstruction.  Spleen: Normal.  Pancreas: Normal.  Adrenals: Normal.   Kidneys:  There is a 2.6 x 2.1 cm thick walled T2 heterogenous lesion along the medial left inferior pole with multiple thick enhancing septation and mural nodule measuring up to 1.1 cm that show contrast hyperenhancement. No extension into the renal vein.  1 cm inherently T1 bright, T2 intermediate, nonenhancing exophytic lesion along the posterior right  superior pole, likely hemorrhagic cyst.  Subcentimeter cyst in the left inferior pole.   Stomach/bowel: Unremarkable.  Peritoneum: No masses or ascites.  Nodes: No enlarged lymph nodes.   Musculoskeletal: Marrow signal appropriate for age. No fractures, neoplasm, or destructive lesions.   CONCLUSION:   1. Left renal mass (Bosniak IV lesion) measuring 2.6 cm, concerning for RCC. No evidence of extension into the renal vein/IVC. No enlarged lymph nodes in the renal hilum or metastases in the abdomen  2. Right renal hemorrhagic cyst measuring 1 cm.   I independently reviewed the above imaging studies.  Assessment and Plan Nitesh Pitstick is a 39 y.o. 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 We discussed that the mass in question has features concerning for malignancy. I explained the natural history of presumed renal cell carcinoma. I reviewed the AUA guidelines for evaluation and treatment of renal masses. The options of active surveillance, in situ tumor ablation, partial and radical nephrectomy was discussed. The risks of laparoscopic LEFT radical nephrectomy were discussed in detail including but not limited to: negative pathology,  open conversion, infection of the skin/abdominal cavity, VTE, MI/CVA, lymphatic leak, injury to adjacent solid/hollow viscus organs, bleeding requiring a blood transfusion, catastrophic bleeding, hernia formation and other imponderables. The patient voices understanding and wishes to proceed.   We discussed the vasectomy procedure in detail. We went over the pertinent anatomy of the scrotum and vasa.   We discussed the fact that the patient must absolutely be sure that he does not want to father a child under any circumstances and was counseled to talk with  his partner regarding this decision. We then discussed the potential risks and complications of this procedure, including but not limited to epididymitis, orchalgia (typically transient discomfort) and scrotal ecchymosis/swelling after the procedure, which can occur on one or both sides; sperm granuloma, which was described in detail as being caused by the presence of some sperm leaking out from the severed ends of the vasa causing an inflammatory reaction and oftentimes resulting in a small round area of scar that may persist; hematoma and bleeding into the scrotum, which can be significant and possibly require a second surgery to drain the accumulated hematoma, as well as how to prevent this by decreased activity level. We discussed superficial skin infection, which is rare, and abscess formation, which is exceedingly rare and may require drainage as well. We discussed the potential for loss of the testicle(s) secondary to procedural complications. Finally, we went over recanalization, which can occur in 1 in 2000 cases.   The patient was given written information regarding all of these risks and complications. Also, informed of preoperative preparation of shaving the scrotum, and given written information indicating that immediately after the procedure continued birth control/contraception needs to be used until a semen specimen is negative for viable sperm 3 months after the procedure. He voices understanding and wishes to proceed.   Ellison Hughs, MD 05/31/2020, 12:13 PM  [facility] Pager: [phone]

## 2020-05-31 NOTE — Anesthesia Procedure Notes (Signed)
Procedure Name: Intubation Date/Time: 05/31/2020 12:38 PM Performed by: Claudia Desanctis, CRNA Pre-anesthesia Checklist: Patient identified, Emergency Drugs available, Suction available and Patient being monitored Patient Re-evaluated:Patient Re-evaluated prior to induction Oxygen Delivery Method: Circle system utilized Preoxygenation: Pre-oxygenation with 100% oxygen Induction Type: IV induction Ventilation: Mask ventilation without difficulty Laryngoscope Size: 2 and Miller Grade View: Grade I Tube type: Oral Tube size: 8.0 mm Number of attempts: 1 Airway Equipment and Method: Stylet Placement Confirmation: ETT inserted through vocal cords under direct vision,  positive ETCO2 and breath sounds checked- equal and bilateral Secured at: 21 cm Tube secured with: Tape Dental Injury: Teeth and Oropharynx as per pre-operative assessment

## 2020-05-31 NOTE — Transfer of Care (Signed)
Immediate Anesthesia Transfer of Care Note  Patient: Darryl Roach  Procedure(s) Performed: XI ROBOTIC ASSISTED LAPAROSCOPIC RADICAL NEPHRECTOMY (Left ) VASECTOMY (N/A )  Patient Location: PACU  Anesthesia Type:General  Level of Consciousness: awake, alert , oriented and patient cooperative  Airway & Oxygen Therapy: Patient Spontanous Breathing and Patient connected to face mask  Post-op Assessment: Report given to RN and Post -op Vital signs reviewed and stable  Post vital signs: Reviewed and stable  Last Vitals:  Vitals Value Taken Time  BP 155/95 05/31/20 1605  Temp    Pulse 59 05/31/20 1609  Resp 26 05/31/20 1609  SpO2 99 % 05/31/20 1609  Vitals shown include unvalidated device data.  Last Pain:  Vitals:   05/31/20 1049  TempSrc:   PainSc: 0-No pain         Complications: No complications documented.

## 2020-06-01 ENCOUNTER — Encounter (HOSPITAL_COMMUNITY): Payer: Self-pay | Admitting: Urology

## 2020-06-01 ENCOUNTER — Other Ambulatory Visit: Payer: Self-pay

## 2020-06-01 LAB — BASIC METABOLIC PANEL
Anion gap: 10 (ref 5–15)
BUN: 13 mg/dL (ref 6–20)
CO2: 26 mmol/L (ref 22–32)
Calcium: 9.3 mg/dL (ref 8.9–10.3)
Chloride: 99 mmol/L (ref 98–111)
Creatinine, Ser: 1.15 mg/dL (ref 0.61–1.24)
GFR calc Af Amer: >60 mL/min (ref >60)
GFR calc non Af Amer: >60 mL/min (ref >60)
Glucose, Bld: 126 mg/dL — ABNORMAL HIGH (ref 70–99)
Potassium: 4 mmol/L (ref 3.5–5.1)
Sodium: 135 mmol/L (ref 135–145)

## 2020-06-01 LAB — HEMOGLOBIN AND HEMATOCRIT, BLOOD
HCT: 46.3 % (ref 39.0–52.0)
Hemoglobin: 15.5 g/dL (ref 13.0–17.0)

## 2020-06-01 MED ORDER — CHLORHEXIDINE GLUCONATE CLOTH 2 % EX PADS
6.0000 | MEDICATED_PAD | Freq: Every day | CUTANEOUS | Status: DC
  Administered 2020-06-01 – 2020-06-03 (×3): 6 via TOPICAL

## 2020-06-01 NOTE — Progress Notes (Signed)
Pt care assumed by this RN at 1215 this shift. I have reviewed the previous RN's assessment and agree with her findings. Currently the pt is resting comfortably in the bed in no acute distress. Will continue to monitor.

## 2020-06-01 NOTE — Plan of Care (Signed)
  Problem: Education: Goal: Knowledge of General Education information will improve Description: Including pain rating scale, medication(s)/side effects and non-pharmacologic comfort measures Outcome: Completed/Met   Problem: Activity: Goal: Risk for activity intolerance will decrease Outcome: Completed/Met   Problem: Coping: Goal: Level of anxiety will decrease Outcome: Completed/Met   Problem: Pain Managment: Goal: General experience of comfort will improve Outcome: Completed/Met

## 2020-06-01 NOTE — Progress Notes (Signed)
1 Day Post-Op Subjective: Patient reports mild abdominal. 1 episode of emesis this morning. hgb 15.5. negative flatus  Objective: Vital signs in last 24 hours: Temp:  [97.7 F (36.5 C)-100.2 F (37.9 C)] 98.9 F (37.2 C) (07/17 1302) Pulse Rate:  [40-72] 40 (07/17 1341) Resp:  [12-18] 14 (07/17 1302) BP: (130-161)/(78-110) 133/80 (07/17 1302) SpO2:  [98 %-100 %] 99 % (07/17 1302)  Intake/Output from previous day: 07/16 0701 - 07/17 0700 In: 2380 [P.O.:180; I.V.:2100; IV Piggyback:100] Out: 525 [Urine:500; Blood:25] Intake/Output this shift: Total I/O In: 300 [I.V.:300] Out: 1750 [Urine:1750]  Physical Exam:  General:alert, cooperative and appears stated age GI: soft, non tender, normal bowel sounds, no palpable masses, no organomegaly, no inguinal hernia Male genitalia: not done Extremities: extremities normal, atraumatic, no cyanosis or edema  Lab Results: Recent Labs    05/31/20 1650 06/01/20 0510  HGB 15.4 15.5  HCT 45.7 46.3   BMET Recent Labs    06/01/20 0510  NA 135  K 4.0  CL 99  CO2 26  GLUCOSE 126*  BUN 13  CREATININE 1.15  CALCIUM 9.3   No results for input(s): LABPT, INR in the last 72 hours. No results for input(s): LABURIN in the last 72 hours. Results for orders placed or performed during the hospital encounter of 05/28/20  SARS CORONAVIRUS 2 (TAT 6-24 HRS) Nasopharyngeal Nasopharyngeal Swab     Status: None   Collection Time: 05/28/20  2:03 PM   Specimen: Nasopharyngeal Swab  Result Value Ref Range Status   SARS Coronavirus 2 NEGATIVE NEGATIVE Final    Comment: (NOTE) SARS-CoV-2 target nucleic acids are NOT DETECTED.  The SARS-CoV-2 RNA is generally detectable in upper and lower respiratory specimens during the acute phase of infection. Negative results do not preclude SARS-CoV-2 infection, do not rule out co-infections with other pathogens, and should not be used as the sole basis for treatment or other patient management  decisions. Negative results must be combined with clinical observations, patient history, and epidemiological information. The expected result is Negative.  Fact Sheet for Patients: SugarRoll.be  Fact Sheet for Healthcare Providers: https://www.woods-mathews.com/  This test is not yet approved or cleared by the [place] FDA and  has been authorized for detection and/or diagnosis of SARS-CoV-2 by FDA under an Emergency Use Authorization (EUA). This EUA will remain  in effect (meaning this test can be used) for the duration of the COVID-19 declaration under Se ction 564(b)(1) of the Act, 21 U.S.C. section 360bbb-3(b)(1), unless the authorization is terminated or revoked sooner.  Performed at [facility], [place] [address], [place], [place] [address]     Studies/Results: No results found.  Assessment/Plan: POD#1 left radical nephrectomy 1. D/c foley 2. Advance diet as tolerated 3. Continue current pain control regiment 4. Ambulate in halls with assistance.   LOS: 1 day   Nicolette Bang 06/01/2020, 3:00 PM

## 2020-06-01 NOTE — Plan of Care (Signed)
  Problem: Bowel/Gastric: Goal: Gastrointestinal status for postoperative course will improve Outcome: Progressing   Problem: Respiratory: Goal: Ability to achieve and maintain a regular respiratory rate will improve Outcome: Progressing   Problem: Urinary Elimination: Goal: Ability to avoid or minimize complications of infection will improve Outcome: Progressing   Problem: Education: Goal: Knowledge of General Education information will improve Description: Including pain rating scale, medication(s)/side effects and non-pharmacologic comfort measures Outcome: Completed/Met   Problem: Activity: Goal: Risk for activity intolerance will decrease Outcome: Completed/Met   Problem: Coping: Goal: Level of anxiety will decrease Outcome: Completed/Met   Problem: Pain Managment: Goal: General experience of comfort will improve Outcome: Completed/Met   Problem: Education: Goal: Knowledge of the prescribed therapeutic regimen will improve Outcome: Completed/Met

## 2020-06-02 NOTE — Progress Notes (Signed)
2 Days Post-Op Subjective: Patient reports mild abdominal. Flatus this morning and diet advanced but patient has not tolerated regular diet  Objective: Vital signs in last 24 hours: Temp:  [97.6 F (36.4 C)-99.1 F (37.3 C)] 97.6 F (36.4 C) (07/18 1339) Pulse Rate:  [45-52] 52 (07/18 1339) Resp:  [18-20] 18 (07/18 1339) BP: (135-143)/(82-98) 135/82 (07/18 1339) SpO2:  [96 %-100 %] 96 % (07/18 1339)  Intake/Output from previous day: 07/17 0701 - 07/18 0700 In: 946.3 [I.V.:946.3] Out: 3550 [Urine:3550] Intake/Output this shift: Total I/O In: 1719.9 [I.V.:1719.9] Out: 1625 [Urine:1625]  Physical Exam:  General:alert, cooperative and appears stated age GI: soft, non tender, normal bowel sounds, no palpable masses, no organomegaly, no inguinal hernia Male genitalia: not done Extremities: extremities normal, atraumatic, no cyanosis or edema  Lab Results: Recent Labs    05/31/20 1650 06/01/20 0510  HGB 15.4 15.5  HCT 45.7 46.3   BMET Recent Labs    06/01/20 0510  NA 135  K 4.0  CL 99  CO2 26  GLUCOSE 126*  BUN 13  CREATININE 1.15  CALCIUM 9.3   No results for input(s): LABPT, INR in the last 72 hours. No results for input(s): LABURIN in the last 72 hours. Results for orders placed or performed during the hospital encounter of 05/28/20  SARS CORONAVIRUS 2 (TAT 6-24 HRS) Nasopharyngeal Nasopharyngeal Swab     Status: None   Collection Time: 05/28/20  2:03 PM   Specimen: Nasopharyngeal Swab  Result Value Ref Range Status   SARS Coronavirus 2 NEGATIVE NEGATIVE Final    Comment: (NOTE) SARS-CoV-2 target nucleic acids are NOT DETECTED.  The SARS-CoV-2 RNA is generally detectable in upper and lower respiratory specimens during the acute phase of infection. Negative results do not preclude SARS-CoV-2 infection, do not rule out co-infections with other pathogens, and should not be used as the sole basis for treatment or other patient management decisions. Negative  results must be combined with clinical observations, patient history, and epidemiological information. The expected result is Negative.  Fact Sheet for Patients: SugarRoll.be  Fact Sheet for Healthcare Providers: https://www.woods-mathews.com/  This test is not yet approved or cleared by the [place] FDA and  has been authorized for detection and/or diagnosis of SARS-CoV-2 by FDA under an Emergency Use Authorization (EUA). This EUA will remain  in effect (meaning this test can be used) for the duration of the COVID-19 declaration under Se ction 564(b)(1) of the Act, 21 U.S.C. section 360bbb-3(b)(1), unless the authorization is terminated or revoked sooner.  Performed at [facility], [place] [address], [place], [place] [address]     Studies/Results: No results found.  Assessment/Plan: POD#2 left radical nephrectomy 1. D/c foley 2. Clear liquid as patient is not tolerating regular diet 3. Continue current pain control regiment 4. Ambulate in halls with assistance.   LOS: 2 days   Nicolette Bang 06/02/2020, 6:59 PM

## 2020-06-03 LAB — SURGICAL PATHOLOGY

## 2020-06-03 MED ORDER — ACETAMINOPHEN 500 MG PO TABS
1000.0000 mg | ORAL_TABLET | Freq: Four times a day (QID) | ORAL | Status: DC
  Administered 2020-06-03 (×2): 1000 mg via ORAL
  Filled 2020-06-03 (×2): qty 2

## 2020-06-03 NOTE — Discharge Summary (Signed)
Date of admission: 05/31/2020  Date of discharge: 06/03/2020  Admission diagnosis: Left renal mass  Discharge diagnosis: Left renal mass  Secondary diagnoses: None  History and Physical: For full details, please see admission history and physical. Briefly, Darryl Roach is a 39 y.o. year old patient with history of 5cm left renal mass.   Hospital Course: Patient underwent left robotic radical nephrectomy on 05/31/20, which he tolerated well. Post operative course was notable for nausea and emesis on POD1. Patient diet was advanced on POD2, and by discharge he was tolerating a regular diet. Catheter was removed on POD2 and patient was able to void spontaneously. By POD3, patient was ambulating, tolerating a regular diet, passing flatus, and had pain well controlled with PO medications. He was discharged to home on POD3.  Laboratory values:  Recent Labs    05/31/20 1650 06/01/20 0510  HGB 15.4 15.5  HCT 45.7 46.3   Recent Labs    06/01/20 0510  CREATININE 1.15    Disposition: Home  Discharge instruction: The patient was instructed to be ambulatory but told to refrain from heavy lifting, strenuous activity, or driving.   Discharge medications:  Allergies as of 06/03/2020   No Known Allergies     Medication List    STOP taking these medications   meloxicam 15 MG tablet Commonly known as: MOBIC   Vitamin D 50 MCG (2000 UT) Caps     TAKE these medications   benazepril-hydrochlorthiazide 20-25 MG tablet Commonly known as: LOTENSIN HCT Take 1 tablet by mouth daily.   cyclobenzaprine 10 MG tablet Commonly known as: FLEXERIL Take 1 tablet (10 mg total) by mouth 3 (three) times daily as needed for muscle spasms.   docusate sodium 100 MG capsule Commonly known as: COLACE Take 1 capsule (100 mg total) by mouth 2 (two) times daily.   HYDROcodone-acetaminophen 5-325 MG tablet Commonly known as: Norco Take 1-2 tablets by mouth every 6 (six) hours as needed for moderate pain.    loratadine 10 MG tablet Commonly known as: CLARITIN Take 10 mg by mouth daily.   Omeprazole 20 MG Tbec Take 20 mg by mouth daily.   promethazine 12.5 MG tablet Commonly known as: PHENERGAN Take 1 tablet (12.5 mg total) by mouth every 4 (four) hours as needed for nausea or vomiting.   vortioxetine HBr 10 MG Tabs tablet Commonly known as: Trintellix Take 1 tablet (10 mg total) by mouth daily.       Followup:   Follow-up Information    Ceasar Mons, MD On 06/14/2020.   Specialty: Urology Why: at 9:30 Contact information: [place] [address] [place] [address] [phone]

## 2020-06-03 NOTE — Plan of Care (Signed)
  Problem: Bowel/Gastric: Goal: Gastrointestinal status for postoperative course will improve Outcome: Progressing   Problem: Clinical Measurements: Goal: Postoperative complications will be avoided or minimized Outcome: Progressing   Problem: Skin Integrity: Goal: Demonstration of wound healing without infection will improve Outcome: Progressing   Problem: Health Behavior/Discharge Planning: Goal: Ability to manage health-related needs will improve Outcome: Completed/Met   Problem: Nutrition: Goal: Adequate nutrition will be maintained Outcome: Completed/Met

## 2020-06-07 ENCOUNTER — Other Ambulatory Visit: Payer: Self-pay

## 2020-06-07 ENCOUNTER — Encounter (HOSPITAL_COMMUNITY): Payer: Self-pay

## 2020-06-07 ENCOUNTER — Ambulatory Visit (HOSPITAL_COMMUNITY)
Admission: RE | Admit: 2020-06-07 | Discharge: 2020-06-07 | Disposition: A | Discharge status: Home / Self Care | Payer: Managed Care, Other (non HMO) | Source: Ambulatory Visit | Attending: Family Medicine | Admitting: Family Medicine

## 2020-06-07 VITALS — BP 155/97 | HR 87 | Temp 99.2°F | Resp 18

## 2020-06-07 DIAGNOSIS — H6981 Other specified disorders of Eustachian tube, right ear: Secondary | ICD-10-CM

## 2020-06-07 MED ORDER — FLUTICASONE PROPIONATE 50 MCG/ACT NA SUSP: 1.0000 | Freq: Every day | NASAL | 0 refills | Status: DC

## 2020-06-07 NOTE — ED Provider Notes (Addendum)
[place]    CSN: [id number] Arrival date & time: 06/07/20  1427      History   Chief Complaint Chief Complaint  Patient presents with   Appointment   Otalgia    HPI Darryl Roach is a 39 y.o. male history of kidney cancer, presenting today for evaluation of ear discomfort.  Patient reports over the past 4 days he has felt as if there is fluid on his ear and sound has been distorted/muffled.  Has a slight aching pain associated.  Denies associated URI symptoms.  Does take daily Claritin. 1 week s/p nephrectomy.  HPI  Past Medical History:  Diagnosis Date   Anxiety    Cancer ([place])    kidney   Chronic kidney disease    Hypertension    Sterile pyuria 04/14/2017    Patient Active Problem List   Diagnosis Date Noted   Renal mass 05/31/2020   Renal mass, left 03/28/2020   Rupture of proximal biceps tendon, initial encounter 03/19/2020   Left lower quadrant abdominal pain 03/19/2020   Rib pain on right side 12/20/2019   Generalized anxiety disorder 05/17/2019   Mild episode of recurrent major depressive disorder ([place]) 05/17/2019   Neck pain 05/17/2019   Rotator cuff tear, left 05/17/2019   Postnasal drip 05/17/2019   History of compression fracture of vertebral column 02/03/2018   Essential hypertension 07/27/2017   Sterile pyuria 04/14/2017    Past Surgical History:  Procedure Laterality Date   NO PAST SURGERIES     ROBOT ASSISTED LAPAROSCOPIC NEPHRECTOMY Left 05/31/2020   Procedure: XI ROBOTIC ASSISTED LAPAROSCOPIC RADICAL NEPHRECTOMY;  Surgeon: Ceasar Mons, MD;  Location: [facility];  Service: Urology;  Laterality: Left;   VASECTOMY N/A 05/31/2020   Procedure: VASECTOMY;  Surgeon: Ceasar Mons, MD;  Location: [facility];  Service: Urology;  Laterality: N/A;       Home Medications    Prior to Admission medications   Medication Sig Start Date End Date Taking? Authorizing Provider    benazepril-hydrochlorthiazide (LOTENSIN HCT) 20-25 MG tablet Take 1 tablet by mouth daily. 10/02/19  Yes Emeterio Reeve, DO  docusate sodium (COLACE) 100 MG capsule Take 1 capsule (100 mg total) by mouth 2 (two) times daily. 05/31/20  Yes Dancy, Estill Bamberg, PA-C  HYDROcodone-acetaminophen (NORCO) 5-325 MG tablet Take 1-2 tablets by mouth every 6 (six) hours as needed for moderate pain. 05/31/20  Yes Dancy, Amanda, PA-C  loratadine (CLARITIN) 10 MG tablet Take 10 mg by mouth daily.   Yes [provider]  Omeprazole 20 MG TBEC Take 20 mg by mouth daily.   Yes [provider]  promethazine (PHENERGAN) 12.5 MG tablet Take 1 tablet (12.5 mg total) by mouth every 4 (four) hours as needed for nausea or vomiting. 05/31/20  Yes Dancy, Estill Bamberg, PA-C  vortioxetine HBr (TRINTELLIX) 10 MG TABS tablet Take 1 tablet (10 mg total) by mouth daily. 10/02/19  Yes Emeterio Reeve, DO  fluticasone (FLONASE) 50 MCG/ACT nasal spray Place 1-2 sprays into both nostrils daily for 7 days. 06/07/20 06/14/20  Malaisha Silliman, Elesa Hacker, PA-C    Family History Family History  Problem Relation Age of Onset   Depression Mother    Diabetes Mother    Alcohol abuse Father    Alcohol abuse Brother    Heart attack Maternal Grandmother    Alcohol abuse Maternal Grandfather    Heart attack Paternal Grandmother    Cancer Paternal Grandfather        PROSTATE   Stroke  Paternal Grandfather     Social History Social History   Tobacco Use   Smoking status: Never Smoker   Smokeless tobacco: Never Used  Scientific laboratory technician Use: Never used  Substance Use Topics   Alcohol use: Not Currently   Drug use: Yes    Types: Marijuana     Allergies   Patient has no known allergies.   Review of Systems Review of Systems  Constitutional: Negative for activity change, appetite change, chills, fatigue and fever.  HENT: Positive for ear pain and hearing loss. Negative for congestion, rhinorrhea, sinus  pressure, sore throat and trouble swallowing.   Eyes: Negative for discharge and redness.  Respiratory: Negative for cough, chest tightness and shortness of breath.   Cardiovascular: Negative for chest pain.  Gastrointestinal: Negative for abdominal pain, diarrhea, nausea and vomiting.  Musculoskeletal: Negative for myalgias.  Skin: Negative for rash.  Neurological: Negative for dizziness, light-headedness and headaches.     Physical Exam Triage Vital Signs ED Triage Vitals  Enc Vitals Group     BP 06/07/20 1456 (!) 155/97     Pulse Rate 06/07/20 1456 87     Resp 06/07/20 1456 18     Temp 06/07/20 1456 99.2 F (37.3 C)     Temp Source 06/07/20 1456 Oral     SpO2 06/07/20 1456 98 %     Weight --      Height --      Head Circumference --      Peak Flow --      Pain Score 06/07/20 1454 2     Pain Loc --      Pain Edu? --      Excl. in [place]? --    No data found.  Updated Vital Signs BP (!) 155/97 (BP Location: Left Arm)    Pulse 87    Temp 99.2 F (37.3 C) (Oral)    Resp 18    SpO2 98%   Visual Acuity Right Eye Distance:   Left Eye Distance:   Bilateral Distance:    Right Eye Near:   Left Eye Near:    Bilateral Near:     Physical Exam Vitals and nursing note reviewed.  Constitutional:      Appearance: He is well-developed.     Comments: No acute distress  HENT:     Head: Normocephalic and atraumatic.     Ears:     Comments: Bilateral ears without tenderness to palpation of external auricle, tragus and mastoid, EAC's without erythema or swelling, TM's with good bony landmarks and cone of light. Non erythematous.     Nose: Nose normal.     Mouth/Throat:     Comments: Oral mucosa pink and moist, no tonsillar enlargement or exudate. Posterior pharynx patent and nonerythematous, no uvula deviation or swelling. Normal phonation. Eyes:     Conjunctiva/sclera: Conjunctivae normal.  Cardiovascular:     Rate and Rhythm: Normal rate.  Pulmonary:     Effort: Pulmonary  effort is normal. No respiratory distress.  Abdominal:     General: There is no distension.  Musculoskeletal:        General: Normal range of motion.     Cervical back: Neck supple.  Skin:    General: Skin is warm and dry.  Neurological:     Mental Status: He is alert and oriented to person, place, and time.      UC Treatments / Results  Labs (all labs ordered are listed, but only  abnormal results are displayed) Labs Reviewed - No data to display  EKG   Radiology No results found.  Procedures Procedures (including critical care time)  Medications Ordered in UC Medications - No data to display  Initial Impression / Assessment and Plan / UC Course  I have reviewed the triage vital signs and the nursing notes.  Pertinent labs & imaging results that were available during my care of the patient were reviewed by me and considered in my medical decision making (see chart for details).     Ear exam relatively unremarkable, no sign of otitis media/externa requiring antibiotics at this time.  Will treat for eustachian tube dysfunction initiate on Flonase nasal spray x1 to 2 weeks.  Discussed strict return precautions. Patient verbalized understanding and is agreeable with plan.  Final Clinical Impressions(s) / UC Diagnoses   Final diagnoses:  Dysfunction of right eustachian tube     Discharge Instructions     Flonase nasal spray 1 to 2 spray in each nostril daily Follow-up if symptoms not improving or worsening   ED Prescriptions    Medication Sig Dispense Auth. Provider   fluticasone (FLONASE) 50 MCG/ACT nasal spray Place 1-2 sprays into both nostrils daily for 7 days. 1 g Tegh Franek, [place] C, PA-C     PDMP not reviewed this encounter.   Lejuan Botto, [place] C, PA-C 06/07/20 1508    Janith Lima, PA-C 06/07/20 1508

## 2020-06-07 NOTE — ED Triage Notes (Signed)
Pt c/o right ear pain x 4 days ago. Pt states he had a coworker that was sick last week with similar symptoms.

## 2020-06-07 NOTE — Discharge Instructions (Signed)
Flonase nasal spray 1 to 2 spray in each nostril daily Follow-up if symptoms not improving or worsening

## 2020-06-18 ENCOUNTER — Telehealth: Payer: Self-pay | Admitting: Oncology

## 2020-06-18 NOTE — Telephone Encounter (Signed)
Received a new pt referrals for med onc and genetics. Darryl Roach has been scheduled to see Dr. Alen Blew on 8/12 at 2pm and [name] for genetics on 8/16 at 1pm. I was unable to reach the pt due to his voicemail not being setup. Letter mailed with the appointments.

## 2020-06-25 ENCOUNTER — Encounter: Payer: Self-pay | Admitting: Osteopathic Medicine

## 2020-06-25 ENCOUNTER — Ambulatory Visit: Payer: Managed Care, Other (non HMO) | Admitting: Osteopathic Medicine

## 2020-06-25 VITALS — BP 120/82 | HR 76 | Wt 252.0 lb

## 2020-06-25 DIAGNOSIS — R1032 Left lower quadrant pain: Secondary | ICD-10-CM | POA: Diagnosis not present

## 2020-06-25 DIAGNOSIS — K909 Intestinal malabsorption, unspecified: Secondary | ICD-10-CM

## 2020-06-25 NOTE — Progress Notes (Signed)
Darryl Roach is a 39 y.o. male who presents to  [facility]  today, 06/25/20, seeking care for the following: GI concerns   Concerned today about oily BM, L sided abdominal discomfort. Intermittent symptoms ongoing for months. Worse w/ fatty meals  Records reviewed: 03/2020 saw colleague of mine for GI concerns, CT done then to eval for colitis/diverticulitis, no evidence of this was noted but they did fine renal mass on L, MRI obtained, urology referral placed. He underwent left robotic radical nephrectomy on 05/31/20, no pathology records available in our system.   Pt reports multiple scans for abdomen over past few mos, I don't think we have all these records available, urology is out of our computer system. He's got follow-up w/ oncology later this week as well.   On exam, slightly hyperactive BC x4, mild TTP LLQ but no rebound/guarding, neg Murphy's      ASSESSMENT & PLAN with other pertinent findings:  The primary encounter diagnosis was Fatty stool. A diagnosis of LLQ pain was also pertinent to this visit.   IBS or GB/pancreatic issue seems more likely. Pt reports multiple imaging studies w/o mention of GB or pancreatic pathology but I don't have records, will request. Pt would like to avoid expense of more scans, I think this is reasonable, will get labs and trial dietary modifications   Patient Instructions   OK to try OTC supplements:  Peppermint oil Probiotics  Fiber   List of foods to try avoid/eliminate:  2017 UpToDate Characteristics and sources of common FODMAPs  Word that corresponds to letter in acronym Compounds in this category Foods that contain these compounds  F Fermentable  O Oligosaccharides Fructans, galacto-oligosaccharides Wheat, barley, rye, onion, leek, white part of spring onion, garlic, shallots, artichokes, beetroot, fennel, peas, chicory, pistachio, cashews, legumes, lentils, and  chickpeas   D Disaccharides Lactose Milk, custard, ice cream, and yogurt   M Monosaccharides "Free fructose" (fructose in excess of glucose) Apples, pears, mangoes, cherries, watermelon, asparagus, sugar snap peas, honey high-fructose corn syrup   A And  P Polyols Sorbitol, mannitol, maltitol, and xylitol Apples, pears, apricots, cherries, nectarines, peaches, plums, watermelon, mushrooms, cauliflower, artificially sweetened chewing gum and confectionery   FODMAPs: fermentable oligosaccharides, disaccharides, monosaccharides, and polyols. Adapted by permission from [employer]: [employer]. Agustin Cree, [employer], [place]. Short-chain carbohydrates and functional gastrointestinal disorders. Am J Gastroenterol 2013; 108:707. Copyright  2013. www.nature.com/ajg. Graphic [id number] Version 2.0     Orders Placed This Encounter  Procedures  . COMPLETE METABOLIC PANEL WITH GFR  . Lipase  . Gamma GT    No orders of the defined types were placed in this encounter.      Follow-up instructions: Return for RECHECK PENDING RESULTS / IF WORSE OR CHANGE.                                         BP 120/82 (BP Location: Right Arm, Patient Position: Sitting)   Pulse 76   Wt 252 lb (114.3 kg)   SpO2 100%   BMI 33.25 kg/m   Current Meds  Medication Sig  . benazepril-hydrochlorthiazide (LOTENSIN HCT) 20-25 MG tablet Take 1 tablet by mouth daily.  Marland Kitchen docusate sodium (COLACE) 100 MG capsule Take 1 capsule (100 mg total) by mouth 2 (two) times daily.  Marland Kitchen loratadine (CLARITIN) 10 MG tablet  Take 10 mg by mouth daily.  . Omeprazole 20 MG TBEC Take 20 mg by mouth daily.  Marland Kitchen vortioxetine HBr (TRINTELLIX) 10 MG TABS tablet Take 1 tablet (10 mg total) by mouth daily.    No results found for this or any previous visit (from the past 72 hour(s)).  No results found.     All questions at time of visit were answered - patient  instructed to contact office with any additional concerns or updates.  ER/RTC precautions were reviewed with the patient as applicable.   Please note: voice recognition software was used to produce this document, and typos may escape review. Please contact Dr. Sheppard Coil for any needed clarifications.   Total encounter time: 30 minutes.

## 2020-06-25 NOTE — Patient Instructions (Signed)
OK to try OTC supplements:  Peppermint oil Probiotics  Fiber   List of foods to try avoid/eliminate:  2017 UpToDate Characteristics and sources of common FODMAPs  Word that corresponds to letter in acronym Compounds in this category Foods that contain these compounds  F Fermentable  O Oligosaccharides Fructans, galacto-oligosaccharides Wheat, barley, rye, onion, leek, white part of spring onion, garlic, shallots, artichokes, beetroot, fennel, peas, chicory, pistachio, cashews, legumes, lentils, and chickpeas   D Disaccharides Lactose Milk, custard, ice cream, and yogurt   M Monosaccharides "Free fructose" (fructose in excess of glucose) Apples, pears, mangoes, cherries, watermelon, asparagus, sugar snap peas, honey high-fructose corn syrup   A And  P Polyols Sorbitol, mannitol, maltitol, and xylitol Apples, pears, apricots, cherries, nectarines, peaches, plums, watermelon, mushrooms, cauliflower, artificially sweetened chewing gum and confectionery   FODMAPs: fermentable oligosaccharides, disaccharides, monosaccharides, and polyols. Adapted by permission from [employer]: [employer]. Agustin Cree, [employer], [place]. Short-chain carbohydrates and functional gastrointestinal disorders. Am J Gastroenterol 2013; 108:707. Copyright  2013. www.nature.com/ajg. Graphic [phone] Version 2.0

## 2020-06-26 LAB — COMPLETE METABOLIC PANEL WITH GFR
AG Ratio: 1.5 (calc) (ref 1.0–2.5)
ALT: 15 U/L (ref 9–46)
AST: 14 U/L (ref 10–40)
Albumin: 4.5 g/dL (ref 3.6–5.1)
Alkaline phosphatase (APISO): 73 U/L (ref 36–130)
BUN: 16 mg/dL (ref 7–25)
CO2: 27 mmol/L (ref 20–32)
Calcium: 10 mg/dL (ref 8.6–10.3)
Chloride: 99 mmol/L (ref 98–110)
Creat: 1.15 mg/dL (ref 0.60–1.35)
GFR, Est African American: 91 mL/min/{1.73_m2} (ref >=60)
GFR, Est Non African American: 79 mL/min/{1.73_m2} (ref >=60)
Globulin: 3.1 g/dL (calc) (ref 1.9–3.7)
Glucose, Bld: 98 mg/dL (ref 65–99)
Potassium: 4.4 mmol/L (ref 3.5–5.3)
Sodium: 135 mmol/L (ref 135–146)
Total Bilirubin: 0.6 mg/dL (ref 0.2–1.2)
Total Protein: 7.6 g/dL (ref 6.1–8.1)

## 2020-06-26 LAB — LIPASE: Lipase: 19 U/L (ref 7–60)

## 2020-06-26 LAB — GAMMA GT: GGT: 46 U/L (ref 3–95)

## 2020-06-27 ENCOUNTER — Inpatient Hospital Stay: Payer: Managed Care, Other (non HMO) | Attending: Oncology | Admitting: Oncology

## 2020-06-27 ENCOUNTER — Encounter: Payer: Self-pay | Admitting: Oncology

## 2020-06-27 ENCOUNTER — Other Ambulatory Visit: Payer: Self-pay

## 2020-06-27 VITALS — BP 120/81 | HR 79 | Temp 98.6°F | Resp 20 | Ht 73.0 in | Wt 254.2 lb

## 2020-06-27 DIAGNOSIS — C642 Malignant neoplasm of left kidney, except renal pelvis: Secondary | ICD-10-CM

## 2020-06-27 NOTE — Progress Notes (Signed)
Reason for the request:    Kidney cancer  HPI: I was asked by Dr. Lovena Neighbours to evaluate Darryl Roach for diagnosis of kidney cancer.  He is a 39 year old man with history of hypertension who was found to have a left kidney mass in May 2021.  He presented with acute abdominal pain and at that time CT scan obtained showed a solid enhancing mass in the left kidney measuring 4.6 x 2.4 x 2.0 cm.  MRI of the abdomen showed left renal mass that is concerning for RCC.  He has subsequently underwent left nephrectomy completed by Dr. Lovena Neighbours on May 31, 2020.  The final pathology showed a 4.1 cm clear-cell histology and no evidence of lymph node involvement.  The histology indicated clear-cell with a grade 2 with a final pathological staging T3a N0.  Chest x-ray obtained in June 2021 showed a nodular opacity and a CT scan of the chest showed no signs of malignancy.  Postoperatively, he is recovering reasonably well without any recent complications.  He denies abdominal pain or discomfort.  He denies any flank pain or hematuria.  He does not report any headaches, blurry vision, syncope or seizures. Does not report any fevers, chills or sweats.  Does not report any cough, wheezing or hemoptysis.  Does not report any chest pain, palpitation, orthopnea or leg edema.  Does not report any nausea, vomiting or abdominal pain.  Does not report any constipation or diarrhea.  Does not report any skeletal complaints.    Does not report frequency, urgency or hematuria.  Does not report any skin rashes or lesions. Does not report any heat or cold intolerance.  Does not report any lymphadenopathy or petechiae.  Does not report any anxiety or depression.  Remaining review of systems is negative.    Past Medical History:  Diagnosis Date  . Anxiety   . Cancer ([place])    kidney  . Chronic kidney disease   . Hypertension   . Sterile pyuria 04/14/2017  :  Past Surgical History:  Procedure Laterality Date  . NO PAST SURGERIES    .  ROBOT ASSISTED LAPAROSCOPIC NEPHRECTOMY Left 05/31/2020   Procedure: XI ROBOTIC ASSISTED LAPAROSCOPIC RADICAL NEPHRECTOMY;  Surgeon: Ceasar Mons, MD;  Location: [facility];  Service: Urology;  Laterality: Left;  Marland Kitchen VASECTOMY N/A 05/31/2020   Procedure: VASECTOMY;  Surgeon: Ceasar Mons, MD;  Location: [facility];  Service: Urology;  Laterality: N/A;  :   Current Outpatient Medications:  .  benazepril-hydrochlorthiazide (LOTENSIN HCT) 20-25 MG tablet, Take 1 tablet by mouth daily., Disp: 90 tablet, Rfl: 3 .  docusate sodium (COLACE) 100 MG capsule, Take 1 capsule (100 mg total) by mouth 2 (two) times daily., Disp: , Rfl:  .  fluticasone (FLONASE) 50 MCG/ACT nasal spray, Place 1-2 sprays into both nostrils daily for 7 days., Disp: 1 g, Rfl: 0 .  HYDROcodone-acetaminophen (NORCO) 5-325 MG tablet, Take 1-2 tablets by mouth every 6 (six) hours as needed for moderate pain. (Patient not taking: Reported on 06/25/2020), Disp: 30 tablet, Rfl: 0 .  loratadine (CLARITIN) 10 MG tablet, Take 10 mg by mouth daily., Disp: , Rfl:  .  Omeprazole 20 MG TBEC, Take 20 mg by mouth daily., Disp: , Rfl:  .  promethazine (PHENERGAN) 12.5 MG tablet, Take 1 tablet (12.5 mg total) by mouth every 4 (four) hours as needed for nausea or vomiting. (Patient not taking: Reported on 06/25/2020), Disp: 15 tablet, Rfl: 0 .  vortioxetine HBr (TRINTELLIX) 10 MG TABS  tablet, Take 1 tablet (10 mg total) by mouth daily., Disp: 90 tablet, Rfl: 3:  No Known Allergies:  Family History  Problem Relation Age of Onset  . Depression Mother   . Diabetes Mother   . Alcohol abuse Father   . Alcohol abuse Brother   . Heart attack Maternal Grandmother   . Alcohol abuse Maternal Grandfather   . Heart attack Paternal Grandmother   . Cancer Paternal Grandfather        PROSTATE  . Stroke Paternal Grandfather   :  Social History   Socioeconomic History  . Marital status: Single    Spouse name: Not on file  . Number of  children: Not on file  . Years of education: Not on file  . Highest education level: Not on file  Occupational History  . Not on file  Tobacco Use  . Smoking status: Never Smoker  . Smokeless tobacco: Never Used  Vaping Use  . Vaping Use: Never used  Substance and Sexual Activity  . Alcohol use: Not Currently  . Drug use: Yes    Types: Marijuana  . Sexual activity: Not on file  Other Topics Concern  . Not on file  Social History Narrative  . Not on file   Social Determinants of Health   Financial Resource Strain:   . Difficulty of Paying Living Expenses:   Food Insecurity:   . Worried About Charity fundraiser in the Last Year:   . Arboriculturist in the Last Year:   Transportation Needs:   . Film/video editor (Medical):   Marland Kitchen Lack of Transportation (Non-Medical):   Physical Activity:   . Days of Exercise per Week:   . Minutes of Exercise per Session:   Stress:   . Feeling of Stress :   Social Connections:   . Frequency of Communication with Friends and Family:   . Frequency of Social Gatherings with Friends and Family:   . Attends Religious Services:   . Active Member of Clubs or Organizations:   . Attends Archivist Meetings:   Marland Kitchen Marital Status:   Intimate Partner Violence:   . Fear of Current or Ex-Partner:   . Emotionally Abused:   Marland Kitchen Physically Abused:   . Sexually Abused:   :  Pertinent items are noted in HPI.  Exam: Blood pressure 120/81, pulse 79, temperature 98.6 F (37 C), temperature source Temporal, resp. rate 20, height 6\' 1"  (1.854 m), weight 254 lb 3.2 oz (115.3 kg), SpO2 99 %.   ECOG 0 General appearance: alert and cooperative appeared without distress. Head: atraumatic without any abnormalities. Eyes: conjunctivae/corneas clear. PERRL.  Sclera anicteric. Throat: lips, mucosa, and tongue normal; without oral thrush or ulcers. Resp: clear to auscultation bilaterally without rhonchi, wheezes or dullness to percussion. Cardio:  regular rate and rhythm, S1, S2 normal, no murmur, click, rub or gallop GI: soft, non-tender; bowel sounds normal; no masses,  no organomegaly Skin: Skin color, texture, turgor normal. No rashes or lesions Lymph nodes: Cervical, supraclavicular, and axillary nodes normal. Neurologic: Grossly normal without any motor, sensory or deep tendon reflexes. Musculoskeletal: No joint deformity or effusion.  No results for input(s): WBC, HGB, HCT, PLT in the last 72 hours. Recent Labs    06/25/20 1056  NA 135  K 4.4  CL 99  CO2 27  GLUCOSE 98  BUN 16  CREATININE 1.15  CALCIUM 10.0       Assessment and Plan:   39 year old man  with:  1.  Kidney cancer diagnosed in May 2021.  He underwent left nephrectomy completed in July 2021.  The final pathology shows a T3AN0 clear-cell histology with nuclear grade 2.  Staging work-up did not show any evidence of metastatic disease.  The natural course of this disease was reviewed at this time and treatment options moving forward were discussed.  The role of adjuvant therapy for resected kidney cancer was discussed today including clinical trials as well as multiple drugs that have been tested in the space.  For the most part, studies have not showed any substantial overall survival benefit associated with adjuvant therapy utilizing oral targeted agents.  A modest progression free survival has been noted with adjuvant Sutent.  The benefit does not justify the toxicity associated with taking Sutent for 1 year.  The role of adjuvant immunotherapy in the form of Pembrolizumab is a intriguing and positive study that utilize adjuvant Pembrolizumab for 1 year could lead to approval of this drug in this indication.  This has included higher stage including T4 or node positive disease.  Based on these criteria, he would not likely get any benefit from adjuvant therapy given his low risk of relapse.  After discussion today, I recommended continued active surveillance  at this time.  He is scheduled to have repeat imaging studies in the next 4 months with Dr. Lovena Neighbours.  Systemic therapy would only be utilized if he developed metastatic disease.  2.  Genetic counseling: I agree with the referral at this time.  He does not have any clear family history of kidney cancer but would benefit from counseling.  3.  Follow-up: I am happy to see him in the future as needed.  60  minutes were dedicated to this visit. The time was spent on reviewing laboratory data, imaging studies, discussing treatment options, discussing differential diagnosis and answering questions regarding future plan.    A copy of this consult has been forwarded to the requesting physician.

## 2020-06-29 ENCOUNTER — Encounter: Payer: Self-pay | Admitting: Osteopathic Medicine

## 2020-07-01 ENCOUNTER — Inpatient Hospital Stay (HOSPITAL_BASED_OUTPATIENT_CLINIC_OR_DEPARTMENT_OTHER): Payer: Managed Care, Other (non HMO) | Admitting: Genetic Counselor

## 2020-07-01 ENCOUNTER — Other Ambulatory Visit: Payer: Self-pay

## 2020-07-01 ENCOUNTER — Other Ambulatory Visit: Payer: Self-pay | Admitting: Genetic Counselor

## 2020-07-01 ENCOUNTER — Inpatient Hospital Stay: Payer: Managed Care, Other (non HMO)

## 2020-07-01 DIAGNOSIS — C642 Malignant neoplasm of left kidney, except renal pelvis: Secondary | ICD-10-CM | POA: Diagnosis not present

## 2020-07-01 LAB — GENETIC SCREENING ORDER

## 2020-07-01 NOTE — Telephone Encounter (Signed)
I see MR of abdomen is ordered. He should be getting soon and yes ok to order US doppler of scrotum for left tesicular pain.

## 2020-07-01 NOTE — Telephone Encounter (Signed)
Dr. Loni Muse note ok abdomen u/s to evalute liver and gallbladder ok to order along with left tesicular/scrotal dopper and [place] for pain.

## 2020-07-02 ENCOUNTER — Encounter: Payer: Self-pay | Admitting: Genetic Counselor

## 2020-07-02 DIAGNOSIS — C649 Malignant neoplasm of unspecified kidney, except renal pelvis: Secondary | ICD-10-CM | POA: Insufficient documentation

## 2020-07-02 NOTE — Progress Notes (Signed)
REFERRING PROVIDER: Wyatt Portela, MD [place],  [place] [address]  PRIMARY PROVIDER:  Emeterio Reeve, DO  PRIMARY REASON FOR VISIT:  1. Renal cell carcinoma of left kidney (HCC)      HISTORY OF PRESENT ILLNESS:   Mr. Prevo, a 39 y.o. male, was seen for a [place] cancer genetics consultation at the request of Dr. Alen Blew due to a personal history of renal cell carcinoma, clear cell type.  Mr. Petter presents to clinic today to discuss the possibility of a hereditary predisposition to cancer, genetic testing, and to further clarify his future cancer risks, as well as potential cancer risks for family members.   In July 2021, at the age of 53, Mr. 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 The treatment plan included a total left nephrectomy. A small lesion on the right kidney was also noted, although the patient reports that it is anticipated to be benign.    CANCER HISTORY:  Oncology History   No history exists.     Past Medical History:  Diagnosis Date   Anxiety    Cancer ([place])    kidney   Chronic kidney disease    Hypertension    Sterile pyuria 04/14/2017    Past Surgical History:  Procedure Laterality Date   NO PAST SURGERIES     ROBOT ASSISTED LAPAROSCOPIC NEPHRECTOMY Left 05/31/2020   Procedure: XI ROBOTIC ASSISTED LAPAROSCOPIC RADICAL NEPHRECTOMY;  Surgeon: Ceasar Mons, MD;  Location: [facility];  Service: Urology;  Laterality: Left;   VASECTOMY N/A 05/31/2020   Procedure: VASECTOMY;  Surgeon: Ceasar Mons, MD;  Location: [facility];  Service: Urology;  Laterality: N/A;    Social History   Socioeconomic History   Marital status: Single    Spouse name: Not on file   Number of children: Not on file   Years of education: Not on file   Highest education level: Not on file  Occupational History   Not on file  Tobacco Use   Smoking status: Never Smoker   Smokeless tobacco: Never  Used  Vaping Use   Vaping Use: Never used  Substance and Sexual Activity   Alcohol use: Not Currently   Drug use: Yes    Types: Marijuana   Sexual activity: Not on file  Other Topics Concern   Not on file  Social History Narrative   Not on file   Social Determinants of Health   Financial Resource Strain:    Difficulty of Paying Living Expenses:   Food Insecurity:    Worried About Charity fundraiser in the Last Year:    Arboriculturist in the Last Year:   Transportation Needs:    Film/video editor (Medical):    Lack of Transportation (Non-Medical):   Physical Activity:    Days of Exercise per Week:    Minutes of Exercise per Session:   Stress:    Feeling of Stress :   Social Connections:    Frequency of Communication with Friends and Family:    Frequency of Social Gatherings with Friends and Family:    Attends Religious Services:    Active Member of Clubs or Organizations:    Attends Music therapist:    Marital Status:      FAMILY HISTORY:  We obtained a detailed, 4-generation family history.  Significant diagnoses are listed below: Family History  Problem Relation Age of Onset   Depression Mother  Diabetes Mother    Alcohol abuse Father    Alcohol abuse Brother    Heart attack Maternal Grandmother    Alcohol abuse Maternal Grandfather    Heart attack Paternal Grandmother    Cancer Paternal Grandfather        PROSTATE   Stroke Paternal Grandfather    Other Brother        ?? epidydymal cysts    The patient has two brothers who are cancer free.  He reports that one brother has developed 'cysts' after his vasectomy that could possibly be epididymal cysts.  Both parents are living.   The patient's mother has a brother and sister who are cancer free.  The maternal grandparents are deceased from heart disease.  The patient's father has two brothers and a sister.  The paternal grandparents are deceased, the  grandfather had prostate cancer.  Mr. Brod is unaware of previous family history of genetic testing for hereditary cancer risks. Patient's maternal ancestors are of [place] descent, and paternal ancestors are of [place] descent. There is no reported Ashkenazi Jewish ancestry. There is no known consanguinity.  GENETIC COUNSELING ASSESSMENT: Mr. Savage is a 39 y.o. male with a personal history of renal cell carcinoma which is somewhat suggestive of a hereditary cancer syndrome and predisposition to cancer given his young age of onset. We, therefore, discussed and recommended the following at today's visit.   DISCUSSION: We discussed that 15 - 20% of kidney cancer is hereditary.  With the clear cell histology, most cases will be associated with von Hippel-Lindau syndrome VHL)..  There are other genes that can be associated with hereditary kidney cancer syndromes and clear cell pathology.  These include BAP1 and SDHx hereditary paraganglioma/pheochromocytoma syndrome genes.  We discussed that testing is beneficial for several reasons including knowing how to follow individuals after completing their treatment, identifying whether potential treatment options such as PARP inhibitors would be beneficial, and understand if other family members could be at risk for cancer and allow them to undergo genetic testing.   We reviewed the characteristics, features and inheritance patterns of hereditary cancer syndromes. We also discussed genetic testing, including the appropriate family members to test, the process of testing, insurance coverage and turn-around-time for results. We discussed the implications of a negative, positive, carrier and/or variant of uncertain significant result. We recommended Mr. Conde pursue genetic testing for the multi-cancer gene panel.   Based on Mr. Buckbee personal history of cancer, he meets the VHL society  criteria for genetic testing as well as NCCN Hereditary Renal Cell Carcinoma  guidelines.  This includes being diagnosed with kidney cancer under age 25.   Despite that he meets criteria, he may still have an out of pocket cost. We discussed that if his out of pocket cost for testing is over $100, the laboratory will call and confirm whether he wants to proceed with testing.  If the out of pocket cost of testing is less than $100 he will be billed by the genetic testing laboratory.   PLAN: After considering the risks, benefits, and limitations, Mr. Guiney provided informed consent to pursue genetic testing and the blood sample was sent to [facility] for analysis of the multi-cancer gene panel. Results should be available within approximately 2-3 weeks' time, at which point they will be disclosed by telephone to Mr. Raia, as will any additional recommendations warranted by these results. Mr. Tavella will receive a summary of his genetic counseling visit and a copy of his results once  available. This information will also be available in Epic.   Lastly, we encouraged Mr. Dacus to remain in contact with cancer genetics annually so that we can continuously update the family history and inform him of any changes in cancer genetics and testing that may be of benefit for this family.   Mr. Schor questions were answered to his satisfaction today. Our contact information was provided should additional questions or concerns arise. Thank you for the referral and allowing [place] to share in the care of your patient.   Jaliah Foody P. Florene Glen, [place], [facility] Licensed, Insurance risk surveyor [name] phone: [phone]  The patient was seen for a total of 35 minutes in face-to-face genetic counseling.  This patient was discussed with Drs. Magrinat, Lindi Adie and/or Burr Medico who agrees with the above.    _______________________________________________________________________ For Office Staff:  Number of people involved in session: 1 Was an Intern/ student involved with case: yes  Layne Benton

## 2020-07-18 ENCOUNTER — Encounter: Payer: Self-pay | Admitting: Genetic Counselor

## 2020-07-18 ENCOUNTER — Telehealth: Payer: Self-pay | Admitting: Genetic Counselor

## 2020-07-18 DIAGNOSIS — Z1379 Encounter for other screening for genetic and chromosomal anomalies: Secondary | ICD-10-CM | POA: Insufficient documentation

## 2020-07-18 NOTE — Telephone Encounter (Signed)
Revealed negative genetic testing.  Discussed that we do not know why he has kidney cancer or why there is cancer in the family. It could be due to a different gene that we are not testing, or maybe our current technology may not be able to pick something up.  It will be important for him to keep in contact with genetics to keep up with whether additional testing may be needed.  Two VUS identified on testing.  This will not change medical management.

## 2020-07-19 ENCOUNTER — Ambulatory Visit: Payer: Self-pay | Admitting: Genetic Counselor

## 2020-07-19 DIAGNOSIS — Z1379 Encounter for other screening for genetic and chromosomal anomalies: Secondary | ICD-10-CM

## 2020-07-19 NOTE — Progress Notes (Signed)
HPI:  Mr. Olgin was previously seen in the [place] clinic due to a personal history of early onset kidney cancer and concerns regarding a hereditary predisposition to cancer. Please refer to our prior cancer genetics clinic note for more information regarding our discussion, assessment and recommendations, at the time. Mr. Dave recent genetic test results were disclosed to him, as were recommendations warranted by these results. These results and recommendations are discussed in more detail below.  CANCER HISTORY:  Oncology History  Renal cell carcinoma ([place])  07/02/2020 Initial Diagnosis   Renal cell carcinoma ([place])   07/17/2020 Genetic Testing   Negative genetic testing on the Multicancer panel.  DIS3L2 Gain (Exon 9) Copy number = 3 and NF2 c.1018G>A VUS.  The Multi-Gene Panel offered by [facility] includes sequencing and/or deletion duplication testing of the following 85 genes: AIP, ALK, APC, ATM, AXIN2,BAP1,  BARD1, BLM, BMPR1A, BRCA1, BRCA2, BRIP1, CASR, CDC73, CDH1, CDK4, CDKN1B, CDKN1C, CDKN2A (p14ARF), CDKN2A (p16INK4a), CEBPA, CHEK2, CTNNA1, DICER1, DIS3L2, EGFR (c.2369C>T, p.Thr790Met variant only), EPCAM (Deletion/duplication testing only), FH, FLCN, GATA2, GPC3, GREM1 (Promoter region deletion/duplication testing only), HOXB13 (c.251G>A, p.Gly84Glu), HRAS, KIT, MAX, MEN1, MET, MITF (c.952G>A, p.Glu318Lys variant only), MLH1, MSH2, MSH3, MSH6, MUTYH, NBN, NF1, NF2, NTHL1, PALB2, PDGFRA, PHOX2B, PMS2, POLD1, POLE, POT1, PRKAR1A, PTCH1, PTEN, RAD50, RAD51C, RAD51D, RB1, RECQL4, RET, RNF43, RUNX1, SDHAF2, SDHA (sequence changes only), SDHB, SDHC, SDHD, SMAD4, SMARCA4, SMARCB1, SMARCE1, STK11, SUFU, TERC, TERT, TMEM127, TP53, TSC1, TSC2, VHL, WRN and WT1.  The report date is July 17, 2020.     FAMILY HISTORY:  We obtained a detailed, 4-generation family history.  Significant diagnoses are listed below: Family History  Problem Relation Age of Onset  . Depression Mother     . Diabetes Mother   . Alcohol abuse Father   . Alcohol abuse Brother   . Heart attack Maternal Grandmother   . Alcohol abuse Maternal Grandfather   . Heart attack Paternal Grandmother   . Cancer Paternal Grandfather        PROSTATE  . Stroke Paternal Grandfather   . Other Brother        ?? epidydymal cysts    The patient has two brothers who are cancer free.  He reports that one brother has developed 'cysts' after his vasectomy that could possibly be epididymal cysts.  Both parents are living.   The patient's mother has a brother and sister who are cancer free.  The maternal grandparents are deceased from heart disease.  The patient's father has two brothers and a sister.  The paternal grandparents are deceased, the grandfather had prostate cancer.  Mr. Wence is unaware of previous family history of genetic testing for hereditary cancer risks. Patient's maternal ancestors are of [place] descent, and paternal ancestors are of [place] descent. There is no reported Ashkenazi Jewish ancestry. There is no known consanguinity.    GENETIC TEST RESULTS: Genetic testing reported out on July 18, 2020 through the Multi- cancer panel found no pathogenic mutations. The Multi-Gene Panel offered by [facility] includes sequencing and/or deletion duplication testing of the following 85 genes: AIP, ALK, APC, ATM, AXIN2,BAP1,  BARD1, BLM, BMPR1A, BRCA1, BRCA2, BRIP1, CASR, CDC73, CDH1, CDK4, CDKN1B, CDKN1C, CDKN2A (p14ARF), CDKN2A (p16INK4a), CEBPA, CHEK2, CTNNA1, DICER1, DIS3L2, EGFR (c.2369C>T, p.Thr790Met variant only), EPCAM (Deletion/duplication testing only), FH, FLCN, GATA2, GPC3, GREM1 (Promoter region deletion/duplication testing only), HOXB13 (c.251G>A, p.Gly84Glu), HRAS, KIT, MAX, MEN1, MET, MITF (c.952G>A, p.Glu318Lys variant only), MLH1, MSH2, MSH3, MSH6, MUTYH, NBN, NF1, NF2, NTHL1, PALB2,  PDGFRA, PHOX2B, PMS2, POLD1, POLE, POT1, PRKAR1A, PTCH1, PTEN, RAD50, RAD51C, RAD51D, RB1, RECQL4, RET,  RNF43, RUNX1, SDHAF2, SDHA (sequence changes only), SDHB, SDHC, SDHD, SMAD4, SMARCA4, SMARCB1, SMARCE1, STK11, SUFU, TERC, TERT, TMEM127, TP53, TSC1, TSC2, VHL, WRN and WT1. The test report has been scanned into EPIC and is located under the Molecular Pathology section of the Results Review tab.  A portion of the result report is included below for reference.     We discussed with Mr. Hessling that because current genetic testing is not perfect, it is possible there may be a gene mutation in one of these genes that current testing cannot detect, but that chance is small.  We also discussed, that there could be another gene that has not yet been discovered, or that we have not yet tested, that is responsible for the cancer diagnoses in the family. It is also possible there is a hereditary cause for the cancer in the family that Mr. Launer did not inherit and therefore was not identified in his testing.  Therefore, it is important to remain in touch with cancer genetics in the future so that we can continue to offer Mr. Fawaz the most up to date genetic testing.   Genetic testing did identify two Variants of uncertain significance (VUS) - one in the DIS3L2 gene called Gain (Exon 9) and a second in the NF2 gene called c.1018G>A.  At this time, it is unknown if these variants are associated with increased cancer risk or if they are normal findings, but most variants such as these get reclassified to being inconsequential. They should not be used to make medical management decisions. With time, we suspect the lab will determine the significance of these variants, if any. If we do learn more about them, we will try to contact Mr. Esquivias to discuss it further. However, it is important to stay in touch with [place] periodically and keep the address and phone number up to date.  ADDITIONAL GENETIC TESTING: We discussed with Mr. Benzel that his genetic testing was fairly extensive.  If there are genes identified to increase  cancer risk that can be analyzed in the future, we would be happy to discuss and coordinate this testing at that time.    CANCER SCREENING RECOMMENDATIONS: Mr. Cahalan test result is considered negative (normal).  This means that we have not identified a hereditary cause for his personal history of early onset kidney cancer at this time. Most cancers happen by chance and this negative test suggests that his cancer may fall into this category.    While reassuring, this does not definitively rule out a hereditary predisposition to cancer. It is still possible that there could be genetic mutations that are undetectable by current technology. There could be genetic mutations in genes that have not been tested or identified to increase cancer risk.  Therefore, it is recommended he continue to follow the cancer management and screening guidelines provided by his oncology and primary healthcare provider.   An individual's cancer risk and medical management are not determined by genetic test results alone. Overall cancer risk assessment incorporates additional factors, including personal medical history, family history, and any available genetic information that may result in a personalized plan for cancer prevention and surveillance  RECOMMENDATIONS FOR FAMILY MEMBERS:  Individuals in this family might be at some increased risk of developing cancer, over the general population risk, simply due to the family history of cancer.  We recommended women in this family have a  yearly mammogram beginning at age 54, or 40 years younger than the earliest onset of cancer, an annual clinical breast exam, and perform monthly breast self-exams. Women in this family should also have a gynecological exam as recommended by their primary provider. All family members should be referred for colonoscopy starting at age 14.  FOLLOW-UP: Lastly, we discussed with Mr. Withem that cancer genetics is a rapidly advancing field and it is  possible that new genetic tests will be appropriate for him and/or his family members in the future. We encouraged him to remain in contact with cancer genetics on an annual basis so we can update his personal and family histories and let him know of advances in cancer genetics that may benefit this family.   Our contact number was provided. Mr. Riolo questions were answered to his satisfaction, and he knows he is welcome to call us at anytime with additional questions or concerns.   Roma Kayser, [place], [facility] Licensed, Certified Genetic Counselor [name]

## 2020-07-24 ENCOUNTER — Telehealth: Payer: Self-pay | Admitting: Oncology

## 2020-07-24 NOTE — Telephone Encounter (Signed)
[id number] Faxed medical records to [facility] @ [phone]

## 2020-11-14 ENCOUNTER — Ambulatory Visit (HOSPITAL_COMMUNITY)
Admission: RE | Admit: 2020-11-14 | Discharge: 2020-11-14 | Disposition: A | Discharge status: Home / Self Care | Payer: Managed Care, Other (non HMO) | Source: Ambulatory Visit | Attending: Urology | Admitting: Urology

## 2020-11-14 ENCOUNTER — Other Ambulatory Visit (HOSPITAL_COMMUNITY): Payer: Self-pay | Admitting: Urology

## 2020-11-14 ENCOUNTER — Other Ambulatory Visit: Payer: Self-pay

## 2020-11-14 DIAGNOSIS — C642 Malignant neoplasm of left kidney, except renal pelvis: Secondary | ICD-10-CM | POA: Diagnosis present

## 2020-11-28 ENCOUNTER — Other Ambulatory Visit: Payer: Self-pay

## 2020-11-28 ENCOUNTER — Encounter: Payer: Self-pay | Admitting: Osteopathic Medicine

## 2020-11-28 ENCOUNTER — Ambulatory Visit (INDEPENDENT_AMBULATORY_CARE_PROVIDER_SITE_OTHER): Payer: Managed Care, Other (non HMO) | Admitting: Osteopathic Medicine

## 2020-11-28 VITALS — BP 129/82 | HR 57 | Temp 98.1°F | Wt 248.0 lb

## 2020-11-28 DIAGNOSIS — R1032 Left lower quadrant pain: Secondary | ICD-10-CM | POA: Diagnosis not present

## 2020-11-28 DIAGNOSIS — M546 Pain in thoracic spine: Secondary | ICD-10-CM

## 2020-11-28 DIAGNOSIS — Z91013 Allergy to seafood: Secondary | ICD-10-CM

## 2020-11-28 DIAGNOSIS — M4014 Other secondary kyphosis, thoracic region: Secondary | ICD-10-CM | POA: Diagnosis not present

## 2020-11-28 DIAGNOSIS — G8929 Other chronic pain: Secondary | ICD-10-CM

## 2020-11-28 DIAGNOSIS — K909 Intestinal malabsorption, unspecified: Secondary | ICD-10-CM | POA: Diagnosis not present

## 2020-11-28 MED ORDER — BENAZEPRIL-HYDROCHLOROTHIAZIDE 20-25 MG PO TABS: 0.5000 | ORAL_TABLET | Freq: Every day | ORAL | 3 refills | Status: DC

## 2020-11-28 NOTE — Progress Notes (Signed)
Darryl Roach is a 40 y.o. male who presents to  [place] [facility]  today, 11/28/20, seeking care for the following:  . Back pain - chronic issue, worse. Recent CT Chest w/ contrast done by urology for f/u renal mass was also (+)thoracic kyphosis and lower thoracic spondylosis noted. He does have pretty significant kyphosis, always present. He has been successful with weight loss. Requests to get back in with PT and requests referral to spine specialist to discuss potential long-term treatments to keep from worsening  . 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   . Still having issues with greasy stool and LLQ pain despite diet modification  . Notes since weight loss more orthostatic dizziness issues and ED.      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 Diagnoses of Fatty stool, LLQ pain, Chronic midline thoracic back pain, and Shellfish allergy were also pertinent to this visit.   --> referrals as below for back pain (PT and neurosurgery/spine), allergy testing, GI  --> reduce BP medication to hopefully help w/ orthostatic symptoms and ED, MyChart message set to check in on him in a coupl eweek re: BP and symptoms    No results found for this or any previous visit (from the past 24 hour(s)).  There are no Patient Instructions on file for this visit.  Orders Placed This Encounter  Procedures  . Ambulatory referral to Gastroenterology  . Ambulatory referral to Physical Therapy  . Ambulatory referral to Neurosurgery  . Ambulatory referral to Allergy    Meds ordered this encounter  Medications  . benazepril-hydrochlorthiazide (LOTENSIN HCT) 20-25 MG tablet    Sig: Take 0.5 tablets by mouth daily.    Dispense:  90 tablet    Refill:  3       Follow-up instructions: Return in about 6 months  (around 05/28/2021) for ANNUAL CHECK-UP - SEE [place] SOONER IF NEEDED.                                         BP 129/82 (BP Location: Left Arm, Patient Position: Sitting, Cuff Size: Large)   Pulse (!) 57   Temp 98.1 F (36.7 C) (Oral)   Wt 248 lb 0.6 oz (112.5 kg)   BMI 32.72 kg/m  Constitutional:  . VSS, see nurse notes . General Appearance: alert, well-developed, well-nourished, NAD Respiratory: . Normal respiratory effort . Breath sounds normal, no wheeze/rhonchi/rales Cardiovascular: . S1/S2 normal, no murmur/rub/gallop auscultated   Current Meds  Medication Sig  . loratadine (CLARITIN) 10 MG tablet Take 10 mg by mouth daily.  . Omeprazole 20 MG TBEC Take 20 mg by mouth daily.  Marland Kitchen vortioxetine HBr (TRINTELLIX) 10 MG TABS tablet Take 1 tablet (10 mg total) by mouth daily.  . [DISCONTINUED] benazepril-hydrochlorthiazide (LOTENSIN HCT) 20-25 MG tablet Take 1 tablet by mouth daily.    No results found for this or any previous visit (from the past 72 hour(s)).  No results found.     All questions at time of visit were answered - patient instructed to contact office with any additional concerns or updates.  ER/RTC precautions were reviewed with the patient as applicable.   Please note: voice recognition software was used to produce this document, and typos may  escape review. Please contact Dr. Sheppard Coil for any needed clarifications.

## 2020-12-04 ENCOUNTER — Encounter: Payer: Self-pay | Admitting: Osteopathic Medicine

## 2020-12-08 ENCOUNTER — Other Ambulatory Visit: Payer: Self-pay | Admitting: Osteopathic Medicine

## 2020-12-09 MED ORDER — VORTIOXETINE HBR 10 MG PO TABS: 10.0000 mg | ORAL_TABLET | Freq: Every day | ORAL | 3 refills | Status: DC

## 2021-01-08 ENCOUNTER — Ambulatory Visit: Payer: Managed Care, Other (non HMO) | Admitting: Gastroenterology

## 2021-01-14 NOTE — Progress Notes (Deleted)
New Patient Note  RE: Darryl Roach MRN: [id number] DOB: 1981/02/16 Date of Office Visit: 01/15/2021  Referring provider: Emeterio Reeve, DO Primary care provider: Emeterio Reeve, DO  Chief Complaint: No chief complaint on file.  History of Present Illness: I had the pleasure of seeing Darryl Roach for initial evaluation at the [facility] on 01/14/2021. He is a 40 y.o. male, who is referred here by Emeterio Reeve, DO for the evaluation of food allergy.  He reports food allergy to ***. The reaction occurred at the age of ***, after he ate *** amount of ***. Symptoms started within *** and was in the form of *** hives, swelling, wheezing, abdominal pain, diarrhea, vomiting. ***Denies any associated cofactors such as exertion, infection, NSAID use, or alcohol consumption. The symptoms lasted for ***. He was evaluated in ED and received ***. Since this episode, he does *** not report other accidental exposures to ***. He does *** not have access to epinephrine autoinjector and *** needed to use it.   Past work up includes: immunocap which showed *** and skin prick testing which showed ***.  Dietary History: patient has been eating other foods including ***milk, ***eggs, ***peanut, ***treenuts, ***sesame, ***shellfish, ***fish, ***soy, ***wheat, ***meats, ***fruits and ***vegetables.  He reports reading labels and avoiding *** in diet completely. He tolerates ***baked egg and baked milk products.   Assessment and Plan: [name] is a 40 y.o. male with: No problem-specific Assessment & Plan notes found for this encounter.  No follow-ups on file.  No orders of the defined types were placed in this encounter.  Lab Orders  No laboratory test(s) ordered today    Other allergy screening: Asthma: {Blank single:19197::"yes","no"} Rhino conjunctivitis: {Blank single:19197::"yes","no"} Food allergy: {Blank single:19197::"yes","no"} Medication allergy: {Blank  single:19197::"yes","no"} Hymenoptera allergy: {Blank single:19197::"yes","no"} Urticaria: {Blank single:19197::"yes","no"} Eczema:{Blank single:19197::"yes","no"} History of recurrent infections suggestive of immunodeficency: {Blank single:19197::"yes","no"}  Diagnostics: Spirometry:  Tracings reviewed. His effort: {Blank single:19197::"Good reproducible efforts.","It was hard to get consistent efforts and there is a question as to whether this reflects a maximal maneuver.","Poor effort, data can not be interpreted."} FVC: ***L FEV1: ***L, ***% predicted FEV1/FVC ratio: ***% Interpretation: {Blank single:19197::"Spirometry consistent with mild obstructive disease","Spirometry consistent with moderate obstructive disease","Spirometry consistent with severe obstructive disease","Spirometry consistent with possible restrictive disease","Spirometry consistent with mixed obstructive and restrictive disease","Spirometry uninterpretable due to technique","Spirometry consistent with normal pattern","No overt abnormalities noted given today's efforts"}.  Please see scanned spirometry results for details.  Skin Testing: {Blank single:19197::"Select foods","Environmental allergy panel","Environmental allergy panel and select foods","Food allergy panel","None","Deferred due to recent antihistamines use"}. Positive test to: ***. Negative test to: ***.  Results discussed with patient/family.   Past Medical History: Patient Active Problem List   Diagnosis Date Noted  . Genetic testing 07/18/2020  . Renal cell carcinoma ([place]) 07/02/2020  . Renal mass 05/31/2020  . Renal mass, left 03/28/2020  . Rupture of proximal biceps tendon, initial encounter 03/19/2020  . Left lower quadrant abdominal pain 03/19/2020  . Rib pain on right side 12/20/2019  . Generalized anxiety disorder 05/17/2019  . 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 Neck pain 05/17/2019  . Rotator cuff  tear, left 05/17/2019  . Postnasal drip 05/17/2019  . History of compression fracture of vertebral column 02/03/2018  . Essential hypertension 07/27/2017  . Sterile pyuria 04/14/2017   Past Medical History:  Diagnosis Date  . Anxiety   . Cancer ([place])    kidney  . Chronic kidney disease   .  Hypertension   . Sterile pyuria 04/14/2017   Past Surgical History: Past Surgical History:  Procedure Laterality Date  . NO PAST SURGERIES    . ROBOT ASSISTED LAPAROSCOPIC NEPHRECTOMY Left 05/31/2020   Procedure: XI ROBOTIC ASSISTED LAPAROSCOPIC RADICAL NEPHRECTOMY;  Surgeon: Ceasar Mons, MD;  Location: [facility];  Service: Urology;  Laterality: Left;  Marland Kitchen VASECTOMY N/A 05/31/2020   Procedure: VASECTOMY;  Surgeon: Ceasar Mons, MD;  Location: [facility];  Service: Urology;  Laterality: N/A;   Medication List:  Current Outpatient Medications  Medication Sig Dispense Refill  . benazepril-hydrochlorthiazide (LOTENSIN HCT) 20-25 MG tablet Take 0.5 tablets by mouth daily. 90 tablet 3  . fluticasone (FLONASE) 50 MCG/ACT nasal spray Place 1-2 sprays into both nostrils daily for 7 days. 1 g 0  . loratadine (CLARITIN) 10 MG tablet Take 10 mg by mouth daily.    . Omeprazole 20 MG TBEC Take 20 mg by mouth daily.    Marland Kitchen vortioxetine HBr (TRINTELLIX) 10 MG TABS tablet Take 1 tablet (10 mg total) by mouth daily. 90 tablet 3   No current facility-administered medications for this visit.   Allergies: No Known Allergies Social History: Social History   Socioeconomic History  . Marital status: Single    Spouse name: Not on file  . Number of children: Not on file  . Years of education: Not on file  . Highest education level: Not on file  Occupational History  . Not on file  Tobacco Use  . Smoking status: Never Smoker  . Smokeless tobacco: Never Used  Vaping Use  . Vaping Use: Never used  Substance and Sexual Activity  . Alcohol use: Not Currently  . Drug use: Yes    Types:  Marijuana  . Sexual activity: Not on file  Other Topics Concern  . Not on file  Social History Narrative  . Not on file   Social Determinants of Health   Financial Resource Strain: Not on file  Food Insecurity: Not on file  Transportation Needs: Not on file  Physical Activity: Not on file  Stress: Not on file  Social Connections: Not on file   Lives in a ***. Smoking: *** Occupation: ***  Environmental HistoryFreight forwarder in the house: Estate agent in the family room: {Blank single:19197::"yes","no"} Carpet in the bedroom: {Blank single:19197::"yes","no"} Heating: {Blank single:19197::"electric","gas","heat pump"} Cooling: {Blank single:19197::"central","window","heat pump"} Pet: {Blank single:19197::"yes ***","no"}  Family History: Family History  Problem Relation Age of Onset  . Depression Mother   . Diabetes Mother   . Alcohol abuse Father   . Alcohol abuse Brother   . Heart attack Maternal Grandmother   . Alcohol abuse Maternal Grandfather   . Heart attack Paternal Grandmother   . Cancer Paternal Grandfather        PROSTATE  . Stroke Paternal Grandfather   . Other Brother        ?? epidydymal cysts   Problem                               Relation Asthma                                   *** Eczema                                ***  Food allergy                          *** Allergic rhino conjunctivitis     ***  Review of Systems  Constitutional: Negative for appetite change, chills, fever and unexpected weight change.  HENT: Negative for congestion and rhinorrhea.   Eyes: Negative for itching.  Respiratory: Negative for cough, chest tightness, shortness of breath and wheezing.   Cardiovascular: Negative for chest pain.  Gastrointestinal: Negative for abdominal pain.  Genitourinary: Negative for difficulty urinating.  Skin: Negative for rash.  Neurological: Negative for headaches.   Objective: There were no vitals  taken for this visit. There is no height or weight on file to calculate BMI. Physical Exam Vitals and nursing note reviewed.  Constitutional:      Appearance: Normal appearance. He is well-developed.  HENT:     Head: Normocephalic and atraumatic.     Right Ear: External ear normal.     Left Ear: External ear normal.     Nose: Nose normal.     Mouth/Throat:     Mouth: Mucous membranes are moist.     Pharynx: Oropharynx is clear.  Eyes:     Conjunctiva/sclera: Conjunctivae normal.  Cardiovascular:     Rate and Rhythm: Normal rate and regular rhythm.     Heart sounds: Normal heart sounds. No murmur heard. No friction rub. No gallop.   Pulmonary:     Effort: Pulmonary effort is normal.     Breath sounds: Normal breath sounds. No wheezing, rhonchi or rales.  Abdominal:     Palpations: Abdomen is soft.  Musculoskeletal:     Cervical back: Neck supple.  Skin:    General: Skin is warm.     Findings: No rash.  Neurological:     Mental Status: He is alert and oriented to person, place, and time.  Psychiatric:        Behavior: Behavior normal.    The plan was reviewed with the patient/family, and all questions/concerned were addressed.  It was my pleasure to see Andrey today and participate in his care. Please feel free to contact me with any questions or concerns.  Sincerely,  Rexene Alberts, DO Allergy & Immunology  [facility] office: [place] office: [phone]

## 2021-01-15 ENCOUNTER — Ambulatory Visit: Payer: Managed Care, Other (non HMO) | Admitting: Allergy

## 2021-02-24 ENCOUNTER — Telehealth: Payer: Self-pay

## 2021-02-24 NOTE — Telephone Encounter (Signed)
Darryl Roach called and stated he feels his anxiety has worsened. He stated he has been feeling "weird" and he thinks its related to anxiety. Pt states he has had a variety of procedures and would like to follow up with his pcp. Pt denies shortness of breath, slurred speech, chest pain or palpitations. Pt has f/u appt for 02/27/21.

## 2021-02-27 ENCOUNTER — Ambulatory Visit: Payer: Managed Care, Other (non HMO) | Admitting: Osteopathic Medicine

## 2021-02-28 ENCOUNTER — Emergency Department (HOSPITAL_COMMUNITY): Payer: Managed Care, Other (non HMO)

## 2021-02-28 ENCOUNTER — Encounter (HOSPITAL_COMMUNITY): Payer: Self-pay | Admitting: Pharmacy Technician

## 2021-02-28 ENCOUNTER — Emergency Department (HOSPITAL_COMMUNITY)
Admission: EM | Admit: 2021-02-28 | Discharge: 2021-02-28 | Disposition: A | Discharge status: Home / Self Care | Payer: Managed Care, Other (non HMO) | Attending: Emergency Medicine | Admitting: Emergency Medicine

## 2021-02-28 DIAGNOSIS — Z79899 Other long term (current) drug therapy: Secondary | ICD-10-CM | POA: Insufficient documentation

## 2021-02-28 DIAGNOSIS — R072 Precordial pain: Secondary | ICD-10-CM | POA: Insufficient documentation

## 2021-02-28 DIAGNOSIS — Z85528 Personal history of other malignant neoplasm of kidney: Secondary | ICD-10-CM | POA: Insufficient documentation

## 2021-02-28 DIAGNOSIS — N189 Chronic kidney disease, unspecified: Secondary | ICD-10-CM | POA: Insufficient documentation

## 2021-02-28 DIAGNOSIS — R0789 Other chest pain: Secondary | ICD-10-CM | POA: Diagnosis present

## 2021-02-28 DIAGNOSIS — R079 Chest pain, unspecified: Secondary | ICD-10-CM

## 2021-02-28 DIAGNOSIS — I129 Hypertensive chronic kidney disease with stage 1 through stage 4 chronic kidney disease, or unspecified chronic kidney disease: Secondary | ICD-10-CM | POA: Insufficient documentation

## 2021-02-28 LAB — COMPREHENSIVE METABOLIC PANEL
ALT: 15 U/L (ref 0–44)
AST: 15 U/L (ref 15–41)
Albumin: 4.1 g/dL (ref 3.5–5.0)
Alkaline Phosphatase: 62 U/L (ref 38–126)
Anion gap: 8 (ref 5–15)
BUN: 18 mg/dL (ref 6–20)
CO2: 25 mmol/L (ref 22–32)
Calcium: 9.6 mg/dL (ref 8.9–10.3)
Chloride: 103 mmol/L (ref 98–111)
Creatinine, Ser: 1.33 mg/dL — ABNORMAL HIGH (ref 0.61–1.24)
GFR, Estimated: >60 mL/min (ref >60)
Glucose, Bld: 99 mg/dL (ref 70–99)
Potassium: 3.9 mmol/L (ref 3.5–5.1)
Sodium: 136 mmol/L (ref 135–145)
Total Bilirubin: 0.7 mg/dL (ref 0.3–1.2)
Total Protein: 7.1 g/dL (ref 6.5–8.1)

## 2021-02-28 LAB — CBC
HCT: 45.5 % (ref 39.0–52.0)
Hemoglobin: 15.4 g/dL (ref 13.0–17.0)
MCH: 30.1 pg (ref 26.0–34.0)
MCHC: 33.8 g/dL (ref 30.0–36.0)
MCV: 88.9 fL (ref 80.0–100.0)
Platelets: 284 10*3/uL (ref 150–400)
RBC: 5.12 MIL/uL (ref 4.22–5.81)
RDW: 12.5 % (ref 11.5–15.5)
WBC: 9.3 10*3/uL (ref 4.0–10.5)
nRBC: 0 % (ref 0.0–0.2)

## 2021-02-28 LAB — TROPONIN I (HIGH SENSITIVITY)
Troponin I (High Sensitivity): 5 ng/L (ref <18)
Troponin I (High Sensitivity): 4 ng/L (ref <18)

## 2021-02-28 MED ORDER — ASPIRIN 81 MG PO CHEW
324.0000 mg | CHEWABLE_TABLET | Freq: Once | ORAL | Status: AC
  Administered 2021-02-28: 324 mg via ORAL
  Filled 2021-02-28: qty 4

## 2021-02-28 NOTE — ED Triage Notes (Signed)
Pt c/o central chest pain radiating to his neck onset approx 1 week ago. Initially pain was intermittent, now constant.

## 2021-02-28 NOTE — ED Provider Notes (Signed)
Patient placed in Quick Look pathway, seen and evaluated   Chief Complaint: chest pain  HPI:   Pt complains of mid sternal chest pain for a wek.  Pt has had some discomfort in left arm and neck tightness   ROS: no cough,   Physical Exam:   Gen: No distress  Neuro: Awake and Alert  Skin: Warm    Focused Exam: WDWN 40 yo Lungs clear Heart RRR    Initiation of care has begun. The patient has been counseled on the process, plan, and necessity for staying for the completion/evaluation, and the remainder of the medical screening examination   Sidney Ace 02/28/21 1425    Arnaldo Natal, MD 02/28/21 [phone]

## 2021-02-28 NOTE — ED Provider Notes (Addendum)
[facility] EMERGENCY DEPARTMENT Provider Note   CSN: [id number] Arrival date & time: 02/28/21  1353     History Chief Complaint  Patient presents with  . Chest Pain    Darryl Roach is a 40 y.o. male.  HPI  HPI: A 40 year old patient with a history of hypertension presents for evaluation of chest pain. Initial onset of pain was more than 6 hours ago. The patient's chest pain is described as heaviness/pressure/tightness and is not worse with exertion. The patient's chest pain is middle- or left-sided, is not well-localized, is not sharp and does not radiate to the arms/jaw/neck. The patient does not complain of nausea and denies diaphoresis. The patient has no history of stroke, has no history of peripheral artery disease, has not smoked in the past 90 days, denies any history of treated diabetes, has no relevant family history of coronary artery disease (first degree relative at less than age 74), has no history of hypercholesterolemia and does not have an elevated BMI (>=30).  40 year old man history of hypertension who has been taken off of his medications after significant weight loss.  He reports that for the past week he has had some anterior chest tightness.  Over the past several days it has been almost constant.  He has not had any associated dyspnea, sweating, or shortness of breath.  He has no prior cardiac history.  He reports he has had some anxiety in the past and first thought that it was anxiety.  He has been at work in [place].  He states here his pain is worse when he lays down flat on his back.  Denies leg swelling, history of PE, or DVT.  Pain is mild at 2 out of 10. Past Medical History:  Diagnosis Date  . Anxiety   . Cancer ([place])    kidney  . Chronic kidney disease   . Hypertension   . Sterile pyuria 04/14/2017    Patient Active Problem List   Diagnosis Date Noted  . Genetic testing 07/18/2020  . Renal cell carcinoma ([place]) 07/02/2020  . Renal  mass 05/31/2020  . Renal mass, left 03/28/2020  . Rupture of proximal biceps tendon, initial encounter 03/19/2020  . Left lower quadrant abdominal pain 03/19/2020  . Rib pain on right side 12/20/2019  . Generalized anxiety disorder 05/17/2019  . 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 Neck pain 05/17/2019  . Rotator cuff tear, left 05/17/2019  . Postnasal drip 05/17/2019  . History of compression fracture of vertebral column 02/03/2018  . Essential hypertension 07/27/2017  . Sterile pyuria 04/14/2017    Past Surgical History:  Procedure Laterality Date  . NO PAST SURGERIES    . ROBOT ASSISTED LAPAROSCOPIC NEPHRECTOMY Left 05/31/2020   Procedure: XI ROBOTIC ASSISTED LAPAROSCOPIC RADICAL NEPHRECTOMY;  Surgeon: Ceasar Mons, MD;  Location: [facility];  Service: Urology;  Laterality: Left;  Marland Kitchen VASECTOMY N/A 05/31/2020   Procedure: VASECTOMY;  Surgeon: Ceasar Mons, MD;  Location: [facility];  Service: Urology;  Laterality: N/A;       Family History  Problem Relation Age of Onset  . Depression Mother   . Diabetes Mother   . Alcohol abuse Father   . Alcohol abuse Brother   . Heart attack Maternal Grandmother   . Alcohol abuse Maternal Grandfather   . Heart attack Paternal Grandmother   . Cancer Paternal Grandfather        PROSTATE  . Stroke Paternal Grandfather   .  Other Brother        ?? epidydymal cysts    Social History   Tobacco Use  . Smoking status: Never Smoker  . Smokeless tobacco: Never Used  Vaping Use  . Vaping Use: Never used  Substance Use Topics  . Alcohol use: Not Currently  . Drug use: Yes    Types: Marijuana    Home Medications Prior to Admission medications   Medication Sig Start Date End Date Taking? Authorizing Provider  benazepril-hydrochlorthiazide (LOTENSIN HCT) 20-25 MG tablet Take 0.5 tablets by mouth daily. 11/28/20   Emeterio Reeve, DO  fluticasone [facility]) 50 MCG/ACT nasal spray Place  1-2 sprays into both nostrils daily for 7 days. 06/07/20 06/14/20  Wieters, Hallie C, PA-C  loratadine (CLARITIN) 10 MG tablet Take 10 mg by mouth daily.    [provider]  Omeprazole 20 MG TBEC Take 20 mg by mouth daily.    [provider]  vortioxetine HBr (TRINTELLIX) 10 MG TABS tablet Take 1 tablet (10 mg total) by mouth daily. 12/09/20   Emeterio Reeve, DO    Allergies    Patient has no known allergies.  Review of Systems   Review of Systems  All other systems reviewed and are negative.   Physical Exam Updated Vital Signs BP (!) 148/105 (BP Location: Right Arm)   Pulse (!) 45   Temp 98.4 F (36.9 C) (Oral)   Resp 18   SpO2 99%   Physical Exam Vitals reviewed.  Constitutional:      Appearance: He is well-developed.  HENT:     Head: Normocephalic.  Eyes:     Pupils: Pupils are equal, round, and reactive to light.  Cardiovascular:     Rate and Rhythm: Normal rate and regular rhythm.     Heart sounds: Normal heart sounds.  Pulmonary:     Effort: Pulmonary effort is normal.     Breath sounds: Normal breath sounds.  Abdominal:     General: Bowel sounds are normal.     Palpations: Abdomen is soft.  Musculoskeletal:        General: Normal range of motion.     Cervical back: Normal range of motion and neck supple.  Skin:    General: Skin is warm.     Capillary Refill: Capillary refill takes less than 2 seconds.  Neurological:     General: No focal deficit present.     Mental Status: He is alert.     ED Results / Procedures / Treatments   Labs (all labs ordered are listed, but only abnormal results are displayed) Labs Reviewed  COMPREHENSIVE METABOLIC PANEL - Abnormal; Notable for the following components:      Result Value   Creatinine, Ser 1.33 (*)    All other components within normal limits  CBC  CBG MONITORING, ED  TROPONIN I (HIGH SENSITIVITY)  TROPONIN I (HIGH SENSITIVITY)    EKG EKG Interpretation  Date/Time:  Friday February 28 2021 14:17:36 EDT Ventricular Rate:  52 PR Interval:  198 QRS Duration: 98 QT Interval:  434 QTC Calculation: 403 R Axis:   59 Text Interpretation: Sinus bradycardia Incomplete right bundle branch block Borderline ECG Confirmed by Pattricia Boss [phone]) on 02/28/2021 8:36:29 PM   Radiology DG Chest Portable 1 View  Result Date: 02/28/2021 CLINICAL DATA:  Chest pain radiating to neck, symptoms for 1 week EXAM: PORTABLE CHEST 1 VIEW COMPARISON:  11/14/2020 FINDINGS: The heart size and mediastinal contours are within normal limits. Both lungs are clear.  The visualized skeletal structures are unremarkable. IMPRESSION: No active disease. Electronically Signed   By: Randa Ngo M.D.   On: 02/28/2021 15:34    Procedures Procedures   Medications Ordered in ED Medications  aspirin chewable tablet 324 mg (324 mg Oral Given 02/28/21 2055)    ED Course  I have reviewed the triage vital signs and the nursing notes.  Pertinent labs & imaging results that were available during my care of the patient were reviewed by me and considered in my medical decision making (see chart for details).    MDM Rules/Calculators/A&P HEAR Score: 34                        40 year old male with substernal anterior chest pain for the past week now present consistently for several days.  Differential diagnosis includes acute coronary syndrome, coronary artery disease, disease of the great vessels, intraparenchymal lung disease, pneumothorax, infection, and upper GI symptoms.  Abdomen is soft nontender I doubt GI etiology.  EKG does not show evidence of acute ischemia.  Initial troponin is normal.  Chest x-Meiko Ives is clear. Patient has normal work-up here including troponin.  Feel that given the length of symptoms he is stable for discharge with 1 troponin. Patient has had some bradycardic episodes with heart rate down into the 40s.  He has remained hemodynamically stable with this.  He reports that he has had this for  several years has noted on his Fitbit that his heart rate will be in the 40s with rest.  He is advised regarding follow-up and return precautions and voices understanding. Final Clinical Impression(s) / ED Diagnoses Final diagnoses:  Nonspecific chest pain    Rx / DC Orders ED Discharge Orders    None       Pattricia Boss, MD 02/28/21 2227    Pattricia Boss, MD 02/28/21 2235

## 2021-03-03 ENCOUNTER — Telehealth: Payer: Self-pay | Admitting: General Practice

## 2021-03-03 NOTE — Telephone Encounter (Signed)
Transition Care Management Follow-up Telephone Call  Date of discharge and from where: 02/28/21 Darryl Roach  How have you been since you were released from the hospital? Feeling much better.  Any questions or concerns? No  Items Reviewed:  Did the pt receive and understand the discharge instructions provided? Yes   Medications obtained and verified? Yes   Other? No   Any new allergies since your discharge? No   Dietary orders reviewed? Yes  Do you have support at home? Yes   Home Care and Equipment/Supplies: Were home health services ordered? no  Functional Questionnaire: (I = Independent and D = Dependent) ADLs: I  Bathing/Dressing- I  Meal Prep- I  Eating- I  Maintaining continence- I  Transferring/Ambulation- I  Managing Meds- I  Follow up appointments reviewed:   PCP Hospital f/u appt confirmed? Yes  Scheduled to see Dr. Sheppard Coil on 03/05/21 @ 0850.  [facility] f/u appt confirmed? No    Are transportation arrangements needed? No   If their condition worsens, is the pt aware to call PCP or go to the Emergency Dept.? Yes  Was the patient provided with contact information for the PCP's office or ED? Yes  Was to pt encouraged to call back with questions or concerns? Yes

## 2021-03-05 ENCOUNTER — Encounter: Payer: Self-pay | Admitting: Osteopathic Medicine

## 2021-03-05 ENCOUNTER — Ambulatory Visit: Payer: Managed Care, Other (non HMO) | Admitting: Osteopathic Medicine

## 2021-03-05 ENCOUNTER — Other Ambulatory Visit: Payer: Self-pay

## 2021-03-05 VITALS — BP 135/81 | HR 61 | Temp 98.2°F | Wt 231.1 lb

## 2021-03-05 DIAGNOSIS — K219 Gastro-esophageal reflux disease without esophagitis: Secondary | ICD-10-CM

## 2021-03-05 DIAGNOSIS — Z0000 Encounter for general adult medical examination without abnormal findings: Secondary | ICD-10-CM | POA: Diagnosis not present

## 2021-03-05 DIAGNOSIS — F411 Generalized anxiety disorder: Secondary | ICD-10-CM | POA: Diagnosis not present

## 2021-03-05 DIAGNOSIS — J4520 Mild intermittent asthma, uncomplicated: Secondary | ICD-10-CM

## 2021-03-05 MED ORDER — FAMOTIDINE 20 MG PO TABS: 20.0000 mg | ORAL_TABLET | Freq: Every day | ORAL | 3 refills | Status: DC

## 2021-03-05 NOTE — Progress Notes (Signed)
Darryl Roach is a 40 y.o. male who presents to  [place] [facility]  today, 03/05/21, seeking care for the following:  . ER f/u: chest pain ACS r/o 02/28/21. CP/tightness about a week prior to arrival in ER. Pt attributed some of this to anxiety but wisely decided to get checked out anyway. BP was elevated at 148/105 in ER but is normal in office today. EKG in ER showed sinus brady w/ rate 52, IRBBB. Reports intermittent low HR based on smart-watch readings. Troponin WNL x1 and felt safe for d/c given duration of symptoms.  . TOC call 03/03/21: feeling improved. F/u in place.  Marland Kitchen Here today 03/05/21 and notes resolution of symptoms at this point.  No exertional chest pain/pressure.  Feels that chest tightness may have been due to some sort of respiratory/allergen exposure when he was traveling, no history of asthma/reactive airway disease.  Lung exam normal today. . Other: Patient would like to discuss coming off/reducing antacid medication, would like to seek therapy referral for anxiety issues.  Had been in therapy several years ago and this worked really well for him and he is not interested in medications at this point for situational or generalized anxiety.   BP Readings from Last 3 Encounters:  03/05/21 135/81  02/28/21 122/87  11/28/20 129/82          ASSESSMENT & PLAN with other pertinent findings:  The primary encounter diagnosis was Mild intermittent reactive airway disease without complication. Diagnoses of Anxiety state, Gastroesophageal reflux disease without esophagitis, and Annual physical exam were also pertinent to this visit.   Given resolution of symptoms, reasonable rule out of acute coronary syndrome in the ED, no continuation of chest pain and relatively low risk, would defer cardiology evaluation for now but would have low threshold for stress test versus other work-up if symptoms recur.  At this point, best guess is  may be some sort of viral illness versus mild reactive airway disease.  Offered patient trial of as needed albuterol/as needed benzodiazepine for panic, he would like to defer these at this point and I think that is totally reasonable.  If symptoms recur, let me know ASAP  Patient Instructions  Acid reflux:  Continue Omeprazole x 2 weeks then stop  Start then Famotidine (Pepcid) now and continue  Darryl Roach will call you re: therapist  Annual in a couple months, labs prior to that visit!     Orders Placed This Encounter  Procedures  . CBC  . CMP14+EGFR  . Lipid panel  . Hemoglobin A1c  . Ambulatory referral to [facility] ordered this encounter  Medications  . famotidine (PEPCID) 20 MG tablet    Sig: Take 1 tablet (20 mg total) by mouth at bedtime.    Dispense:  90 tablet    Refill:  3     See below for relevant physical exam findings  See below for recent lab and imaging results reviewed  Medications, allergies, PMH, PSH, SocH, FamH reviewed below    Follow-up instructions: Return in about 3 months (around 06/04/2021) for [place] (labs 2+ days prior to appt, orders are in).                                        Exam:  BP 135/81 (BP Location: Left Arm, Patient Position: Sitting, Cuff Size: Normal)  Pulse 61   Temp 98.2 F (36.8 C) (Oral)   Wt 231 lb 1.3 oz (104.8 kg)   BMI 30.49 kg/m   Constitutional: VS see above. General Appearance: alert, well-developed, well-nourished, NAD  Neck: No masses, trachea midline.   Respiratory: Normal respiratory effort. no wheeze, no rhonchi, no rales  Cardiovascular: S1/S2 normal, no murmur, no rub/gallop auscultated. RRR.   Musculoskeletal: Gait normal. Symmetric and independent movement of all extremities  Neurological: Normal balance/coordination. No tremor.  Skin: warm, dry, intact.   Psychiatric: Normal judgment/insight. Normal mood and affect. Oriented x3.   Current  Meds  Medication Sig  . famotidine (PEPCID) 20 MG tablet Take 1 tablet (20 mg total) by mouth at bedtime.  . [DISCONTINUED] Omeprazole 20 MG TBEC Take 20 mg by mouth daily.    No Known Allergies  Patient Active Problem List   Diagnosis Date Noted  . Genetic testing 07/18/2020  . Renal cell carcinoma ([place]) 07/02/2020  . Renal mass 05/31/2020  . Renal mass, left 03/28/2020  . Rupture of proximal biceps tendon, initial encounter 03/19/2020  . Left lower quadrant abdominal pain 03/19/2020  . Rib pain on right side 12/20/2019  . Generalized anxiety disorder 05/17/2019  . 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 Neck pain 05/17/2019  . Rotator cuff tear, left 05/17/2019  . Postnasal drip 05/17/2019  . History of compression fracture of vertebral column 02/03/2018  . Essential hypertension 07/27/2017  . Sterile pyuria 04/14/2017    Family History  Problem Relation Age of Onset  . Depression Mother   . Diabetes Mother   . Alcohol abuse Father   . Alcohol abuse Brother   . Heart attack Maternal Grandmother   . Alcohol abuse Maternal Grandfather   . Heart attack Paternal Grandmother   . Cancer Paternal Grandfather        PROSTATE  . Stroke Paternal Grandfather   . Other Brother        ?? epidydymal cysts    Social History   Tobacco Use  Smoking Status Never Smoker  Smokeless Tobacco Never Used    Past Surgical History:  Procedure Laterality Date  . NO PAST SURGERIES    . ROBOT ASSISTED LAPAROSCOPIC NEPHRECTOMY Left 05/31/2020   Procedure: XI ROBOTIC ASSISTED LAPAROSCOPIC RADICAL NEPHRECTOMY;  Surgeon: Ceasar Mons, MD;  Location: [facility];  Service: Urology;  Laterality: Left;  Marland Kitchen VASECTOMY N/A 05/31/2020   Procedure: VASECTOMY;  Surgeon: Ceasar Mons, MD;  Location: [facility];  Service: Urology;  Laterality: N/A;    Immunization History  Administered Date(s) Administered  . Influenza Inj Mdck Quad Pf 07/26/2019  .  Influenza,inj,Quad PF,6+ Mos 08/02/2018  . Influenza-Unspecified 08/09/2019  . Moderna Sars-Covid-2 Vaccination 01/22/2020, 02/19/2020, 09/10/2020  . Tdap 03/07/2018    Recent Results (from the past 2160 hour(s))  Troponin I (High Sensitivity)     Status: None   Collection Time: 02/28/21  2:27 PM  Result Value Ref Range   Troponin I (High Sensitivity) 5 <18 ng/L    Comment: (NOTE) Elevated high sensitivity troponin I (hsTnI) values and significant  changes across serial measurements may suggest ACS but many other  chronic and acute conditions are known to elevate hsTnI results.  Refer to the "Links" section for chest pain algorithms and additional  guidance. Performed at [facility], [place] [address] [place], [place] [address]   CBC     Status: None   Collection Time: 02/28/21  2:27 PM  Result Value Ref Range   WBC 9.3 4.0 - 10.5 K/uL   RBC 5.12 4.22 - 5.81 MIL/uL   Hemoglobin 15.4 13.0 - 17.0 g/dL   HCT 45.5 39.0 - 52.0 %   MCV 88.9 80.0 - 100.0 fL   MCH 30.1 26.0 - 34.0 pg   MCHC 33.8 30.0 - 36.0 g/dL   RDW 12.5 11.5 - 15.5 %   Platelets 284 150 - 400 K/uL   nRBC 0.0 0.0 - 0.2 %    Comment: Performed at [facility], [place] [address] [place], [place] [address]  Comprehensive metabolic panel     Status: Abnormal   Collection Time: 02/28/21  2:27 PM  Result Value Ref Range   Sodium 136 135 - 145 mmol/L   Potassium 3.9 3.5 - 5.1 mmol/L   Chloride 103 98 - 111 mmol/L   CO2 25 22 - 32 mmol/L   Glucose, Bld 99 70 - 99 mg/dL    Comment: Glucose reference range applies only to samples taken after fasting for at least 8 hours.   BUN 18 6 - 20 mg/dL   Creatinine, Ser 1.33 (H) 0.61 - 1.24 mg/dL   Calcium 9.6 8.9 - 10.3 mg/dL   Total Protein 7.1 6.5 - 8.1 g/dL   Albumin 4.1 3.5 - 5.0 g/dL   AST 15 15 - 41 U/L   ALT 15 0 - 44 U/L   Alkaline Phosphatase 62 38 - 126 U/L   Total Bilirubin 0.7 0.3 - 1.2 mg/dL   GFR, Estimated >60 >60 mL/min    Comment:  (NOTE) Calculated using the CKD-EPI Creatinine Equation (2021)    Anion gap 8 5 - 15    Comment: Performed at [facility] [address] [place], [place] [address]  Troponin I (High Sensitivity)     Status: None   Collection Time: 02/28/21  9:14 PM  Result Value Ref Range   Troponin I (High Sensitivity) 4 <18 ng/L    Comment: (NOTE) Elevated high sensitivity troponin I (hsTnI) values and significant  changes across serial measurements may suggest ACS but many other  chronic and acute conditions are known to elevate hsTnI results.  Refer to the "Links" section for chest pain algorithms and additional  guidance. Performed at [facility], [place] [address], [place], [place] [address]     No results found.     All questions at time of visit were answered - patient instructed to contact office with any additional concerns or updates. ER/RTC precautions were reviewed with the patient as applicable.   Please note: manual typing as well as voice recognition software may have been used to produce this document - typos may escape review. Please contact Dr. Sheppard Coil for any needed clarifications.

## 2021-03-05 NOTE — Patient Instructions (Addendum)
Acid reflux:  Continue Omeprazole x 2 weeks then stop  Start then Famotidine (Pepcid) now and continue  [place] will call you re: therapist  Annual in a couple months, labs prior to that visit!

## 2021-05-28 ENCOUNTER — Ambulatory Visit: Payer: Managed Care, Other (non HMO) | Admitting: Osteopathic Medicine

## 2021-05-30 ENCOUNTER — Ambulatory Visit: Payer: Managed Care, Other (non HMO) | Admitting: Osteopathic Medicine

## 2021-05-30 IMAGING — DX DG RIBS W/ CHEST 3+V*R*
4 series · 4 of 4 positions shown · non-contrast
Comparison: None.

CLINICAL DATA: Status post fall with subsequent right rib pain.

EXAM:
RIGHT RIBS AND CHEST - 3+ VIEW

[rib pa]
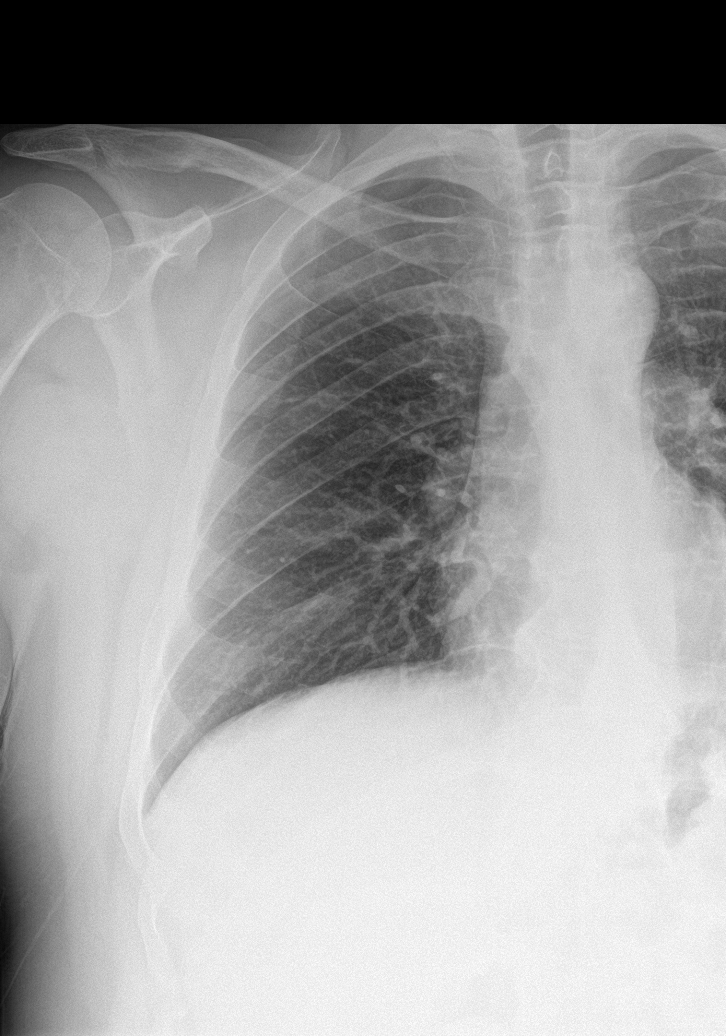

[rib pa obl (1 of 2)]
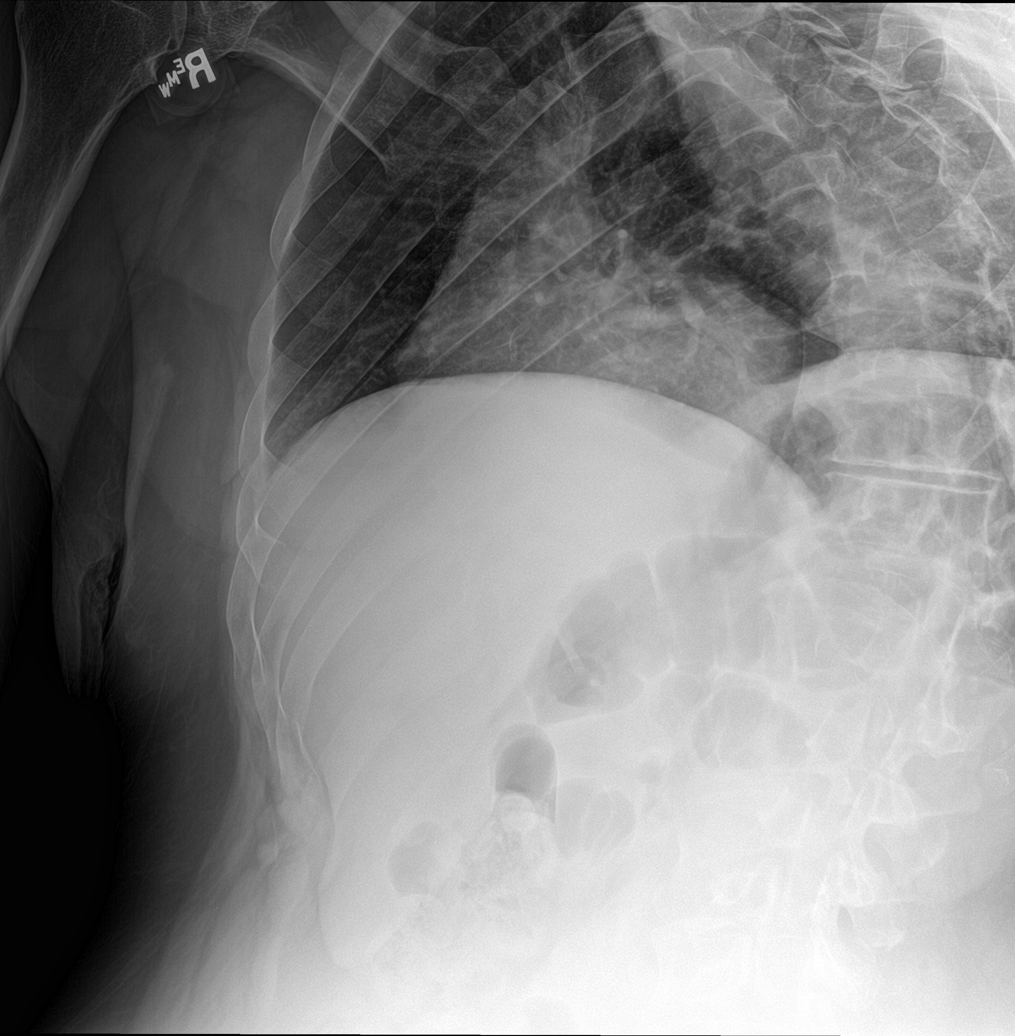

[rib pa obl (2 of 2)]
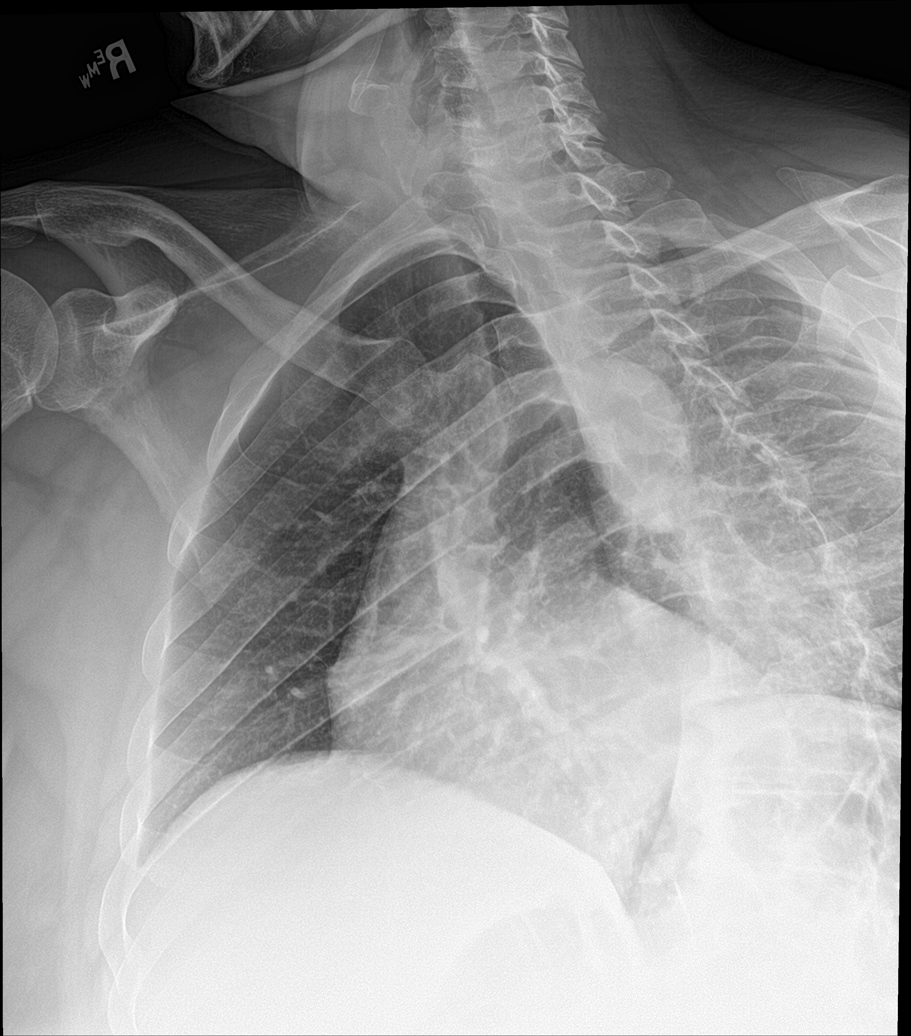

[chest pa]
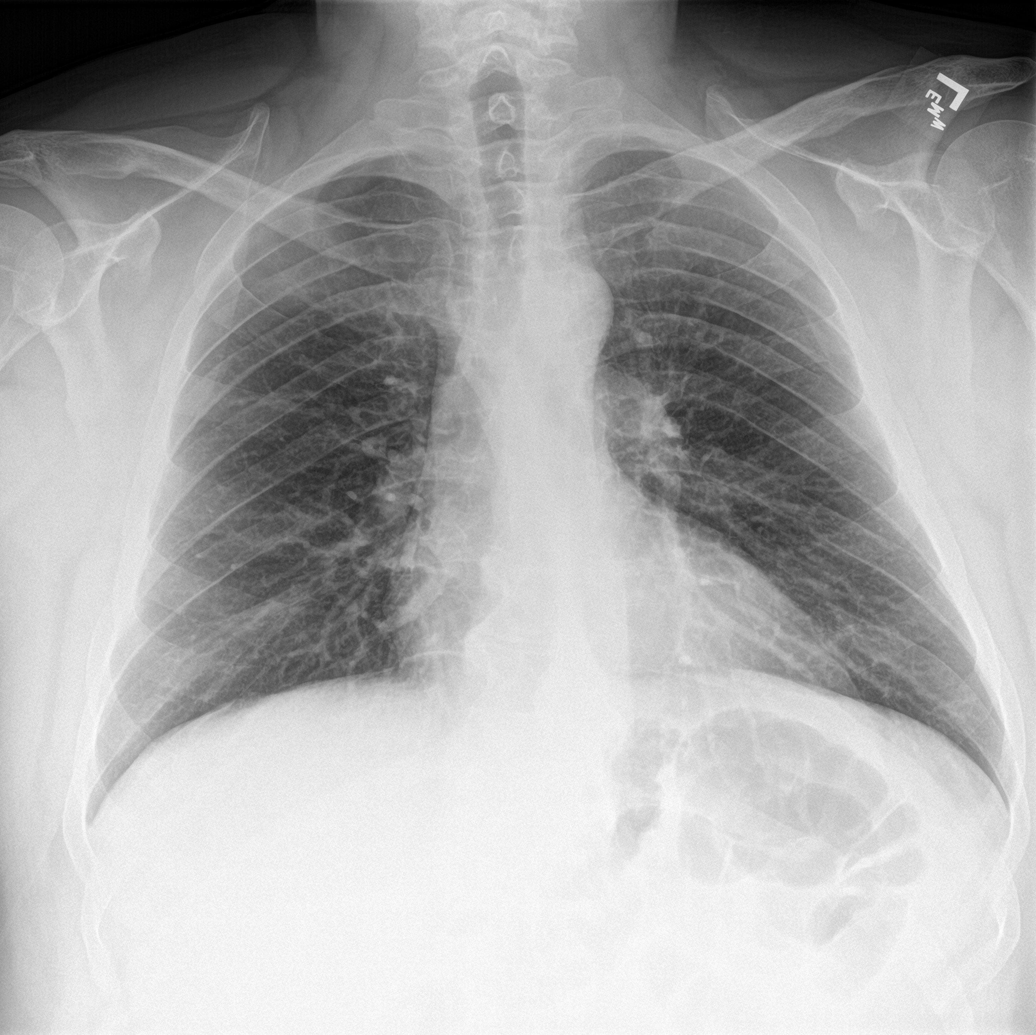

[4 of 4 positions shown; findings below may reference images not displayed]

FINDINGS: No fracture or other bone lesions are seen involving the ribs. There
is no evidence of pneumothorax or pleural effusion. Both lungs are
clear. Heart size and mediastinal contours are within normal limits.
IMPRESSION: Negative.

## 2022-03-05 ENCOUNTER — Other Ambulatory Visit: Payer: Self-pay | Admitting: Osteopathic Medicine

## 2022-03-18 ENCOUNTER — Other Ambulatory Visit: Payer: Self-pay | Admitting: Medical-Surgical

## 2022-08-06 ENCOUNTER — Encounter (HOSPITAL_COMMUNITY): Payer: Self-pay

## 2022-08-06 ENCOUNTER — Emergency Department (HOSPITAL_COMMUNITY)
Admission: EM | Admit: 2022-08-06 | Discharge: 2022-08-06 | Disposition: A | Discharge status: Home / Self Care | Payer: Managed Care, Other (non HMO) | Attending: Emergency Medicine | Admitting: Emergency Medicine

## 2022-08-06 ENCOUNTER — Emergency Department (HOSPITAL_COMMUNITY): Payer: Managed Care, Other (non HMO)

## 2022-08-06 ENCOUNTER — Other Ambulatory Visit: Payer: Self-pay

## 2022-08-06 DIAGNOSIS — N189 Chronic kidney disease, unspecified: Secondary | ICD-10-CM | POA: Diagnosis not present

## 2022-08-06 DIAGNOSIS — I129 Hypertensive chronic kidney disease with stage 1 through stage 4 chronic kidney disease, or unspecified chronic kidney disease: Secondary | ICD-10-CM | POA: Insufficient documentation

## 2022-08-06 DIAGNOSIS — R1011 Right upper quadrant pain: Secondary | ICD-10-CM | POA: Diagnosis present

## 2022-08-06 DIAGNOSIS — R109 Unspecified abdominal pain: Secondary | ICD-10-CM

## 2022-08-06 DIAGNOSIS — Z85528 Personal history of other malignant neoplasm of kidney: Secondary | ICD-10-CM | POA: Diagnosis not present

## 2022-08-06 LAB — CBC WITH DIFFERENTIAL/PLATELET
Abs Immature Granulocytes: 0.02 10*3/uL (ref 0.00–0.07)
Basophils Absolute: 0.1 10*3/uL (ref 0.0–0.1)
Basophils Relative: 1 %
Eosinophils Absolute: 0.2 10*3/uL (ref 0.0–0.5)
Eosinophils Relative: 2 %
HCT: 47.5 % (ref 39.0–52.0)
Hemoglobin: 15.9 g/dL (ref 13.0–17.0)
Immature Granulocytes: 0 %
Lymphocytes Relative: 23 %
Lymphs Abs: 1.9 10*3/uL (ref 0.7–4.0)
MCH: 29.5 pg (ref 26.0–34.0)
MCHC: 33.5 g/dL (ref 30.0–36.0)
MCV: 88.1 fL (ref 80.0–100.0)
Monocytes Absolute: 0.7 10*3/uL (ref 0.1–1.0)
Monocytes Relative: 8 %
Neutro Abs: 5.4 10*3/uL (ref 1.7–7.7)
Neutrophils Relative %: 66 %
Platelets: 283 10*3/uL (ref 150–400)
RBC: 5.39 MIL/uL (ref 4.22–5.81)
RDW: 13.1 % (ref 11.5–15.5)
WBC: 8.2 10*3/uL (ref 4.0–10.5)
nRBC: 0 % (ref 0.0–0.2)

## 2022-08-06 LAB — BASIC METABOLIC PANEL
Anion gap: 8 (ref 5–15)
BUN: 14 mg/dL (ref 6–20)
CO2: 21 mmol/L — ABNORMAL LOW (ref 22–32)
Calcium: 9.4 mg/dL (ref 8.9–10.3)
Chloride: 106 mmol/L (ref 98–111)
Creatinine, Ser: 1.16 mg/dL (ref 0.61–1.24)
GFR, Estimated: >60 mL/min (ref >60)
Glucose, Bld: 93 mg/dL (ref 70–99)
Potassium: 4 mmol/L (ref 3.5–5.1)
Sodium: 135 mmol/L (ref 135–145)

## 2022-08-06 LAB — HEPATIC FUNCTION PANEL
ALT: 25 U/L (ref 0–44)
AST: 26 U/L (ref 15–41)
Albumin: 4.4 g/dL (ref 3.5–5.0)
Alkaline Phosphatase: 52 U/L (ref 38–126)
Bilirubin, Direct: 0.1 mg/dL (ref 0.0–0.2)
Indirect Bilirubin: 0.5 mg/dL (ref 0.3–0.9)
Total Bilirubin: 0.6 mg/dL (ref 0.3–1.2)
Total Protein: 8 g/dL (ref 6.5–8.1)

## 2022-08-06 LAB — URINALYSIS, ROUTINE W REFLEX MICROSCOPIC
Bilirubin Urine: NEGATIVE
Glucose, UA: NEGATIVE mg/dL
Hgb urine dipstick: NEGATIVE
Ketones, ur: NEGATIVE mg/dL
Leukocytes,Ua: NEGATIVE
Nitrite: NEGATIVE
Protein, ur: NEGATIVE mg/dL
Specific Gravity, Urine: 1.006 (ref 1.005–1.030)
pH: 7 (ref 5.0–8.0)

## 2022-08-06 LAB — LIPASE, BLOOD: Lipase: 37 U/L (ref 11–51)

## 2022-08-06 MED ORDER — METHOCARBAMOL 500 MG PO TABS: 1000.0000 mg | ORAL_TABLET | Freq: Two times a day (BID) | ORAL | 0 refills | Status: AC

## 2022-08-06 MED ORDER — LIDOCAINE VISCOUS HCL 2 % MT SOLN
15.0000 mL | Freq: Once | OROMUCOSAL | Status: AC
  Administered 2022-08-06: 15 mL via ORAL
  Filled 2022-08-06: qty 15

## 2022-08-06 MED ORDER — ACETAMINOPHEN 325 MG PO TABS: 650.0000 mg | ORAL_TABLET | Freq: Four times a day (QID) | ORAL | 0 refills | Status: AC | PRN

## 2022-08-06 MED ORDER — ALUM & MAG HYDROXIDE-SIMETH 200-200-20 MG/5ML PO SUSP
30.0000 mL | Freq: Once | ORAL | Status: AC
  Administered 2022-08-06: 30 mL via ORAL
  Filled 2022-08-06: qty 30

## 2022-08-06 MED ORDER — FAMOTIDINE 20 MG PO TABS
20.0000 mg | ORAL_TABLET | Freq: Once | ORAL | Status: AC
  Administered 2022-08-06: 20 mg via ORAL
  Filled 2022-08-06: qty 1

## 2022-08-06 MED ORDER — IBUPROFEN 600 MG PO TABS: 600.0000 mg | ORAL_TABLET | Freq: Four times a day (QID) | ORAL | 0 refills | Status: DC | PRN

## 2022-08-06 NOTE — Discharge Instructions (Signed)
It was a pleasure caring for you today in the emergency department. ° °Please return to the emergency department for any worsening or worrisome symptoms. ° ° °

## 2022-08-06 NOTE — ED Provider Notes (Signed)
[facility] DEPT Provider Note   CSN: [id number] Arrival date & time: 08/06/22  1335     History  Chief Complaint  Patient presents with   Flank Pain    Darryl Roach is a 41 y.o. male.  Patient as above with significant medical history as below, including kidney cancer status post left-sided nephrectomy, hypertension, anxiety, scoliosis who presents to the ED with complaint of right-sided flank pain.  Patient worse pain is been ongoing for about 2 weeks.  Describes an aching, pulling sensation.  Radiates from his back and wraps around to his right upper quadrant.  No change in p.o. intake.  No nausea or vomiting.  No fevers or chills.  No change in bowel or bladder function.  No BRBPR or melena.  No frequent NSAID use, no frequent alcohol or tobacco use.  He does report prior to onset of symptoms that he was moving some objects and feel he may have pulled a muscle.  Denies numbness, tingling, falls.  No fevers or chills.     Past Medical History:  Diagnosis Date   Anxiety    Cancer ([place])    kidney   Chronic kidney disease    Hypertension    Sterile pyuria 04/14/2017    Past Surgical History:  Procedure Laterality Date   NO PAST SURGERIES     ROBOT ASSISTED LAPAROSCOPIC NEPHRECTOMY Left 05/31/2020   Procedure: XI ROBOTIC ASSISTED LAPAROSCOPIC RADICAL NEPHRECTOMY;  Surgeon: Ceasar Mons, MD;  Location: [facility];  Service: Urology;  Laterality: Left;   VASECTOMY N/A 05/31/2020   Procedure: VASECTOMY;  Surgeon: Ceasar Mons, MD;  Location: [facility];  Service: Urology;  Laterality: N/A;     The history is provided by the patient. No language interpreter was used.  Flank Pain Pertinent negatives include no chest pain, no abdominal pain, no headaches and no shortness of breath.       Home Medications Prior to Admission medications   Medication Sig Start Date End Date Taking? Authorizing Provider  acetaminophen (TYLENOL) 325  MG tablet Take 2 tablets (650 mg total) by mouth every 6 (six) hours as needed. 08/06/22  Yes Wynona Dove A, DO  famotidine (PEPCID) 20 MG tablet TAKE 1 TABLET BY MOUTH EVERYDAY AT BEDTIME. LAST REFILL. NEEDS TO TRANSFER CARE TO NEW PCP 03/05/22   Madge Therrien Bouche, NP  methocarbamol (ROBAXIN) 500 MG tablet Take 2 tablets (1,000 mg total) by mouth 2 (two) times daily for 5 days. 08/06/22 08/11/22 Yes Jeanell Sparrow, DO      Allergies    Other    Review of Systems   Review of Systems  Constitutional:  Negative for chills and fever.  HENT:  Negative for facial swelling and trouble swallowing.   Eyes:  Negative for photophobia and visual disturbance.  Respiratory:  Negative for cough and shortness of breath.   Cardiovascular:  Negative for chest pain and palpitations.  Gastrointestinal:  Negative for abdominal pain, nausea and vomiting.  Endocrine: Negative for polydipsia and polyuria.  Genitourinary:  Positive for flank pain. Negative for difficulty urinating and hematuria.  Musculoskeletal:  Positive for arthralgias. Negative for gait problem and joint swelling.  Skin:  Negative for pallor and rash.  Neurological:  Negative for syncope and headaches.  Psychiatric/Behavioral:  Negative for agitation and confusion.     Physical Exam Updated Vital Signs BP (!) 159/98 (BP Location: Left Arm)   Pulse 62   Temp 98.1 F (36.7 C) (Oral)   Resp  16   Ht '6\' 1"'$  (1.854 m)   Wt 113.4 kg   SpO2 100%   BMI 32.98 kg/m  Physical Exam Vitals and nursing note reviewed.  Constitutional:      General: He is not in acute distress.    Appearance: Normal appearance. He is well-developed. He is not ill-appearing or diaphoretic.  HENT:     Head: Normocephalic and atraumatic.     Right Ear: External ear normal.     Left Ear: External ear normal.     Mouth/Throat:     Mouth: Mucous membranes are moist.  Eyes:     General: No scleral icterus. Cardiovascular:     Rate and Rhythm: Normal rate and regular  rhythm.     Pulses: Normal pulses.     Heart sounds: Normal heart sounds.  Pulmonary:     Effort: Pulmonary effort is normal. No respiratory distress.     Breath sounds: Normal breath sounds.  Abdominal:     General: Abdomen is flat.     Palpations: Abdomen is soft.     Tenderness: There is no abdominal tenderness. There is no guarding or rebound.  Musculoskeletal:        General: Normal range of motion.     Cervical back: Normal range of motion.     Right lower leg: No edema.     Left lower leg: No edema.  Skin:    General: Skin is warm and dry.     Capillary Refill: Capillary refill takes less than 2 seconds.  Neurological:     Mental Status: He is alert and oriented to person, place, and time.     GCS: GCS eye subscore is 4. GCS verbal subscore is 5. GCS motor subscore is 6.     Gait: Gait is intact.  Psychiatric:        Mood and Affect: Mood normal.        Behavior: Behavior normal.     ED Results / Procedures / Treatments   Labs (all labs ordered are listed, but only abnormal results are displayed) Labs Reviewed  BASIC METABOLIC PANEL - Abnormal; Notable for the following components:      Result Value   CO2 21 (*)    All other components within normal limits  URINALYSIS, ROUTINE W REFLEX MICROSCOPIC - Abnormal; Notable for the following components:   Color, Urine STRAW (*)    All other components within normal limits  CBC WITH DIFFERENTIAL/PLATELET  LIPASE, BLOOD  HEPATIC FUNCTION PANEL    EKG None  Radiology CT Renal Stone Study  Result Date: 08/06/2022 CLINICAL DATA:  Right flank pain for 2 weeks. Possible kidney stone. EXAM: CT ABDOMEN AND PELVIS WITHOUT CONTRAST TECHNIQUE: Multidetector CT imaging of the abdomen and pelvis was performed following the standard protocol without IV contrast. RADIATION DOSE REDUCTION: This exam was performed according to the departmental dose-optimization program which includes automated exposure control, adjustment of the mA  and/or kV according to patient size and/or use of iterative reconstruction technique. COMPARISON:  Prior CT abdomen/pelvis 03/20/2020 FINDINGS: Lower chest: The lung bases are clear. Visualized cardiac structures are within normal limits for size. No pericardial effusion. Unremarkable visualized distal thoracic esophagus. Hepatobiliary: No new focal liver abnormality is seen. No gallstones, gallbladder wall thickening, or biliary dilatation. Small circumscribed low-attenuation lesion in hepatic segment 4 a remains unchanged compared to prior imaging and is almost certainly a small benign cyst. Pancreas: Unremarkable. No pancreatic ductal dilatation or surrounding inflammatory changes. Spleen: No  splenic injury or perisplenic hematoma. Adrenals/Urinary Tract: Normal adrenal glands. The left kidney is absent. No hydronephrosis or nephrolithiasis on the right. Stable intermediate attenuation 1.5 cm lesion exophytic from the interpolar left kidney. This almost certainly represents a hemorrhagic or proteinaceous cyst. The ureter and bladder are unremarkable. Stomach/Bowel: Colonic diverticular disease without CT evidence of active inflammation. No evidence of obstruction or focal bowel wall thickening. Normal appendix in the right lower quadrant. The terminal ileum is unremarkable. Vascular/Lymphatic: Limited evaluation in the absence of intravenous contrast. No aneurysm. No significant atherosclerotic plaque. No suspicious lymphadenopathy. Reproductive: Prostate is unremarkable. Other: No abdominal wall hernia or abnormality. No abdominopelvic ascites. Musculoskeletal: No acute fracture or aggressive appearing lytic or blastic osseous lesion. IMPRESSION: 1. No evidence of nephro or ureterolithiasis. 2. Solitary right kidney. 3. Colonic diverticular disease without CT evidence of active inflammation. 4. Additional ancillary findings as above without significant interval change. Electronically Signed   By: Jacqulynn Cadet M.D.   On: 08/06/2022 15:29    Procedures Procedures    Medications Ordered in ED Medications  alum & mag hydroxide-simeth (MAALOX/MYLANTA) 200-200-20 MG/5ML suspension 30 mL (30 mLs Oral Given 08/06/22 1946)    And  lidocaine (XYLOCAINE) 2 % viscous mouth solution 15 mL (15 mLs Oral Given 08/06/22 1946)  famotidine (PEPCID) tablet 20 mg (20 mg Oral Given 08/06/22 1946)    ED Course/ Medical Decision Making/ A&P                           Medical Decision Making Amount and/or Complexity of Data Reviewed Labs: ordered.  Risk OTC drugs. Prescription drug management.   This patient presents to the ED with chief complaint(s) of right flank pain with pertinent past medical history of renal cancer, nephrectomy which further complicates the presenting complaint. The complaint involves an extensive differential diagnosis and also carries with it a high risk of complications and morbidity.    The differential diagnosis includes but not limited to MSK, sprain, strain, gastritis, esophagitis, GERD, biliary colic, renal colic, UTI, nephrolithiasis, etc. Serious etiologies were considered.   The initial plan is to screening labs and imaging, GI cocktail   Additional history obtained: Additional history obtained from  not applicable Records reviewed Primary Care Documents prior ED visits, labs and imaging  Independent labs interpretation:  The following labs were independently interpreted:  UDS without evidence of infection Lipase, liver enzymes stable, BMP and CBC stable  Independent visualization of imaging: - I independently visualized the following imaging with scope of interpretation limited to determining acute life threatening conditions related to emergency care: CT renal, which revealed chronic changes, no acute process  Cardiac monitoring was reviewed and interpreted by myself which shows not applicable  Treatment and Reassessment: GI cocktail >> Symptoms  unchanged  Consultation: - Consulted or discussed management/test interpretation w/ external professional: not applicable  Consideration for admission or further workup: Admission was considered     41 year old male history of nephrectomy associated with renal cancer to the ED with right-sided flank pain.  Evaluation in the ED is reassuring.  Physical exam is reassuring.  Abdomen soft, nonperitoneal.  He is tolerating p.o. intake without difficulty.  Discomfort is minimal.  No external evidence of trauma.  No rashes.  Work-up today is reassuring.  Likely MSK as etiology of his discomfort.  Recommend follow-up with PCP.    The patient improved significantly and was discharged in stable condition. Detailed discussions were had with the  patient regarding current findings, and need for close f/u with PCP or on call doctor. The patient has been instructed to return immediately if the symptoms worsen in any way for re-evaluation. Patient verbalized understanding and is in agreement with current care plan. All questions answered prior to discharge.    Social Determinants of health: Social History   Tobacco Use   Smoking status: Never   Smokeless tobacco: Never  Vaping Use   Vaping Use: Never used  Substance Use Topics   Alcohol use: Not Currently   Drug use: Yes    Types: Marijuana            Final Clinical Impression(s) / ED Diagnoses Final diagnoses:  Flank pain    Rx / DC Orders ED Discharge Orders          Ordered    methocarbamol (ROBAXIN) 500 MG tablet  2 times daily        08/06/22 2307    ibuprofen (ADVIL) 600 MG tablet  Every 6 hours PRN,   Status:  Discontinued        08/06/22 2307    acetaminophen (TYLENOL) 325 MG tablet  Every 6 hours PRN        08/06/22 2308              Wynona Dove A, DO 08/07/22 0002

## 2022-08-06 NOTE — ED Provider Triage Note (Signed)
Emergency Medicine Provider Triage Evaluation Note  Darryl Roach , a 41 y.o. male  was evaluated in triage.  Pt complains of right-sided flank pain, urinary frequency over the past week.  History of nephrectomy.  Denies fever, chills.   Review of Systems  Positive: As above Negative: As above  Physical Exam  BP (!) 158/99 (BP Location: Left Arm)   Pulse 60   Temp 98.9 F (37.2 C) (Oral)   Resp 20   Ht '6\' 1"'$  (1.854 m)   Wt 113.4 kg   SpO2 99%   BMI 32.98 kg/m  Gen:   Awake, no distress   Resp:  Normal effort  MSK:   Moves extremities without difficulty  Other:    Medical Decision Making  Medically screening exam initiated at 2:58 PM.  Appropriate orders placed.  Barrett Goldie was informed that the remainder of the evaluation will be completed by another provider, this initial triage assessment does not replace that evaluation, and the importance of remaining in the ED until their evaluation is complete.     Evlyn Courier, PA-C 08/06/22 1459

## 2022-08-06 NOTE — ED Triage Notes (Signed)
Patient c/o right flank pain x 2 weeks. Patient states he had a green tint to his urine x 1 1/2 weeks.  Patient has a history of a left nephrectomy.  Patient also c/o left upper dental pain.

## 2023-03-04 ENCOUNTER — Encounter: Payer: Self-pay | Admitting: Genetic Counselor

## 2023-03-04 NOTE — Progress Notes (Signed)
  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  The amended report date is February 24, 2023.

## 2024-09-15 ENCOUNTER — Other Ambulatory Visit: Payer: Self-pay

## 2024-09-15 ENCOUNTER — Emergency Department (HOSPITAL_COMMUNITY)
Admission: EM | Admit: 2024-09-15 | Discharge: 2024-09-15 | Disposition: A | Discharge status: Home / Self Care | Attending: Emergency Medicine | Admitting: Emergency Medicine

## 2024-09-15 DIAGNOSIS — K1379 Other lesions of oral mucosa: Secondary | ICD-10-CM | POA: Diagnosis present

## 2024-09-15 MED ORDER — BENZOCAINE 10 % MT GEL
Freq: Once | OROMUCOSAL | Status: DC
  Filled 2024-09-15: qty 9.4

## 2024-09-15 MED ORDER — BENZOCAINE 7.5 % MT GEL: Freq: Three times a day (TID) | OROMUCOSAL | 0 refills | Status: AC | PRN

## 2024-09-15 MED ORDER — DEXAMETHASONE SOD PHOSPHATE PF 10 MG/ML IJ SOLN
10.0000 mg | Freq: Once | INTRAMUSCULAR | Status: AC
  Administered 2024-09-15: 10 mg via INTRAMUSCULAR

## 2024-09-15 MED ORDER — DEXAMETHASONE SOD PHOSPHATE PF 10 MG/ML IJ SOLN: 10.0000 mg | Freq: Once | INTRAMUSCULAR | Status: DC

## 2024-09-15 NOTE — ED Notes (Signed)
 ED Provider at bedside.

## 2024-09-15 NOTE — Discharge Instructions (Addendum)
 It is unclear why you are having the symptoms that you have.  Follow-up with ENT as planned.  Please see if they can get CT sooner.  You are given you steroid shot and discharging you with some as needed Orajel for symptom relief. Be careful with chewing food to prevent any injury.

## 2024-09-15 NOTE — ED Triage Notes (Signed)
 PT ambulatory to triage with complaints of tongue pain. Pt reports a recent hx of various oral symptoms, and is being seen by an ENT. Last night the pt states that the tip of his tongue began to hurt.   PT appears to be in no distress.

## 2024-09-15 NOTE — ED Provider Notes (Signed)
 [place] [facility]MENT AT [facility] Provider Note   CSN: [id number] Arrival date & time: 09/15/24  9281     Patient presents with: Oral Pain   Darryl Roach is a 43 y.o. male.   HPI    43 year old male comes in with chief complaint of oral pain. Patient has past medical history of renal cancer status post resection.  He states that he has had some oral discomfort for the last 4 months.  He has seen urgent care, primary care doctor and currently being seen by ENT.  He has been put on antibiotics, steroids and also currently thought to have oral thrush for which she is on medications.  His symptoms typically comprised of tingling and burning type sensation and spasming type feeling of the tongue.  Yesterday started having some sharp pain which was different and therefore decided to come to the ER.  As of now, there is plan for patient to get CT of the neck and face on 10th November followed by ENT appointment on 17th. Patient does not have trouble swallowing.  Prior to Admission medications   Medication Sig Start Date End Date Taking? Authorizing Provider  benzocaine (BABY ORAJEL) 7.5 % oral gel Use as directed in the mouth or throat 3 (three) times daily as needed for pain. 09/15/24  Yes Daxon Kyne, MD  famotidine  (PEPCID ) 20 MG tablet TAKE 1 TABLET BY MOUTH EVERYDAY AT BEDTIME. LAST REFILL. NEEDS TO TRANSFER CARE TO NEW PCP 03/05/22   Willo Mini, NP  acetaminophen  (TYLENOL ) 325 MG tablet Take 2 tablets (650 mg total) by mouth every 6 (six) hours as needed. 08/06/22   Elnor Jayson LABOR, DO    Allergies: Other    Review of Systems  All other systems reviewed and are negative.   Updated Vital Signs BP (!) 163/107 (BP Location: Left Arm)   Pulse 70   Temp 98.3 F (36.8 C) (Oral)   Resp 16   Ht 6' 1 (1.854 m)   Wt 117.9 kg   SpO2 100%   BMI 34.30 kg/m   Physical Exam Vitals and nursing note reviewed.  Constitutional:      Appearance: He is  well-developed.  HENT:     Head: Atraumatic.     Mouth/Throat:     Mouth: Mucous membranes are moist.     Comments: No thrush, no lesions, no gingival swelling or mass.  Patient does have some submandibular swelling on both sides Eyes:     Extraocular Movements: Extraocular movements intact.  Cardiovascular:     Rate and Rhythm: Normal rate.  Pulmonary:     Effort: Pulmonary effort is normal.  Musculoskeletal:     Cervical back: Neck supple.  Skin:    General: Skin is warm.  Neurological:     Mental Status: He is alert and oriented to person, place, and time.     (all labs ordered are listed, but only abnormal results are displayed) Labs Reviewed - No data to display  EKG: None  Radiology: No results found.   Procedures   Medications Ordered in the ED  benzocaine (ORAJEL) 10 % mucosal gel (has no administration in time range)  dexamethasone  (DECADRON ) injection 10 mg (10 mg Intramuscular Given 09/15/24 0932)                                    Medical Decision Making Risk OTC drugs. Prescription drug  management.   43 year old male comes in with chief complaint of tongue discomfort. He has had this oral issue since July.  Patient currently being managed by ENT and has not responded to prednisone, antibiotics and antifungal medication at this time, so it appears that the next step is to get imaging done.  He comes here because his symptoms are slightly worse.  He has history of cancer, which is slightly concerning.  Patient also feels some fullness in the neck area.  On my exam he does have submandibular swelling that appears to be glands.  Oral exam is completely reassuring however.  From emergency perspective, we have low suspicion of deep space infection in the differential.  I discussed with the patient that the next best step is to carry on with the plan that the ENT has formulated, perhaps he can call the imaging center to see if they can put him higher on  the list.  It is critical that he is seen by a clinician who can interpret his data and imaging and make best clinical judgment, and that will not be the case in the ER even if we get a CT scan.  Patient is understanding.  We discussed therapeutic option of IM steroids, as it does appear to me that most likely he is having some inflammation or irritation that is causing neuropathic symptoms.  Patient prefers IM steroids or oral prednisone because he had restlessness as the side effect of prednisone in the past.  I will also give him some oral gel prescription to see if that will help him with symptom management.    Final diagnoses:  Oral pain    ED Discharge Orders          Ordered    benzocaine (BABY ORAJEL) 7.5 % oral gel  3 times daily PRN        09/15/24 0946               Charlyn Sora, MD 09/15/24 1022
